# Patient Record
Sex: Male | Born: 1940 | Race: Asian | Hispanic: Yes | Marital: Married | Smoking: Never smoker
Health system: Southern US, Community
[De-identification: ages and names within clinical notes are randomized; demographics above are authoritative.]

## PROBLEM LIST (undated history)

## (undated) DIAGNOSIS — N400 Enlarged prostate without lower urinary tract symptoms: Secondary | ICD-10-CM

## (undated) HISTORY — PX: ABDOMINAL SURGERY: SHX537

## (undated) HISTORY — PX: CHOLECYSTECTOMY: SHX55

---

## 2017-10-24 ENCOUNTER — Emergency Department (HOSPITAL_COMMUNITY): Payer: Self-pay

## 2017-10-24 ENCOUNTER — Encounter (HOSPITAL_COMMUNITY): Payer: Self-pay | Admitting: *Deleted

## 2017-10-24 ENCOUNTER — Emergency Department (HOSPITAL_BASED_OUTPATIENT_CLINIC_OR_DEPARTMENT_OTHER)
Admit: 2017-10-24 | Discharge: 2017-10-24 | Disposition: A | Discharge status: Home / Self Care | Payer: Self-pay | Attending: Emergency Medicine | Admitting: Emergency Medicine

## 2017-10-24 ENCOUNTER — Other Ambulatory Visit: Payer: Self-pay

## 2017-10-24 ENCOUNTER — Inpatient Hospital Stay (HOSPITAL_COMMUNITY)
Admission: EM | Admit: 2017-10-24 | Discharge: 2017-10-29 | DRG: 299 | Disposition: A | Discharge status: Home / Self Care | Payer: Self-pay | Attending: Family Medicine | Admitting: Family Medicine

## 2017-10-24 DIAGNOSIS — I82402 Acute embolism and thrombosis of unspecified deep veins of left lower extremity: Secondary | ICD-10-CM | POA: Diagnosis present

## 2017-10-24 DIAGNOSIS — N400 Enlarged prostate without lower urinary tract symptoms: Secondary | ICD-10-CM | POA: Diagnosis present

## 2017-10-24 DIAGNOSIS — I959 Hypotension, unspecified: Secondary | ICD-10-CM | POA: Diagnosis not present

## 2017-10-24 DIAGNOSIS — R112 Nausea with vomiting, unspecified: Secondary | ICD-10-CM | POA: Diagnosis not present

## 2017-10-24 DIAGNOSIS — E876 Hypokalemia: Secondary | ICD-10-CM | POA: Diagnosis present

## 2017-10-24 DIAGNOSIS — I82442 Acute embolism and thrombosis of left tibial vein: Secondary | ICD-10-CM | POA: Diagnosis present

## 2017-10-24 DIAGNOSIS — J189 Pneumonia, unspecified organism: Secondary | ICD-10-CM | POA: Diagnosis present

## 2017-10-24 DIAGNOSIS — R000 Tachycardia, unspecified: Secondary | ICD-10-CM

## 2017-10-24 DIAGNOSIS — I82432 Acute embolism and thrombosis of left popliteal vein: Principal | ICD-10-CM | POA: Diagnosis present

## 2017-10-24 DIAGNOSIS — Z86718 Personal history of other venous thrombosis and embolism: Secondary | ICD-10-CM

## 2017-10-24 DIAGNOSIS — I82412 Acute embolism and thrombosis of left femoral vein: Secondary | ICD-10-CM | POA: Diagnosis present

## 2017-10-24 DIAGNOSIS — Z9049 Acquired absence of other specified parts of digestive tract: Secondary | ICD-10-CM

## 2017-10-24 DIAGNOSIS — K029 Dental caries, unspecified: Secondary | ICD-10-CM | POA: Diagnosis present

## 2017-10-24 DIAGNOSIS — R1084 Generalized abdominal pain: Secondary | ICD-10-CM

## 2017-10-24 DIAGNOSIS — I82492 Acute embolism and thrombosis of other specified deep vein of left lower extremity: Secondary | ICD-10-CM | POA: Diagnosis present

## 2017-10-24 DIAGNOSIS — R160 Hepatomegaly, not elsewhere classified: Secondary | ICD-10-CM | POA: Diagnosis present

## 2017-10-24 DIAGNOSIS — I82409 Acute embolism and thrombosis of unspecified deep veins of unspecified lower extremity: Secondary | ICD-10-CM | POA: Diagnosis present

## 2017-10-24 DIAGNOSIS — R0602 Shortness of breath: Secondary | ICD-10-CM

## 2017-10-24 DIAGNOSIS — M7989 Other specified soft tissue disorders: Secondary | ICD-10-CM

## 2017-10-24 DIAGNOSIS — R600 Localized edema: Secondary | ICD-10-CM | POA: Diagnosis present

## 2017-10-24 HISTORY — DX: Benign prostatic hyperplasia without lower urinary tract symptoms: N40.0

## 2017-10-24 LAB — URINALYSIS, ROUTINE W REFLEX MICROSCOPIC
BILIRUBIN URINE: NEGATIVE
GLUCOSE, UA: NEGATIVE mg/dL
Hgb urine dipstick: NEGATIVE
KETONES UR: NEGATIVE mg/dL
Leukocytes, UA: NEGATIVE
Nitrite: NEGATIVE
PROTEIN: NEGATIVE mg/dL
Specific Gravity, Urine: 1.02 (ref 1.005–1.030)
pH: 5 (ref 5.0–8.0)

## 2017-10-24 LAB — COMPREHENSIVE METABOLIC PANEL
ALT: 41 U/L (ref 0–44)
AST: 46 U/L — AB (ref 15–41)
Albumin: 3 g/dL — ABNORMAL LOW (ref 3.5–5.0)
Alkaline Phosphatase: 73 U/L (ref 38–126)
Anion gap: 11 (ref 5–15)
BUN: 5 mg/dL — AB (ref 8–23)
CHLORIDE: 105 mmol/L (ref 98–111)
CO2: 22 mmol/L (ref 22–32)
CREATININE: 1.01 mg/dL (ref 0.61–1.24)
Calcium: 8.7 mg/dL — ABNORMAL LOW (ref 8.9–10.3)
GFR calc Af Amer: >60 mL/min (ref >60)
Glucose, Bld: 127 mg/dL — ABNORMAL HIGH (ref 70–99)
Potassium: 3.7 mmol/L (ref 3.5–5.1)
SODIUM: 138 mmol/L (ref 135–145)
Total Bilirubin: 1.2 mg/dL (ref 0.3–1.2)
Total Protein: 5.9 g/dL — ABNORMAL LOW (ref 6.5–8.1)

## 2017-10-24 LAB — CBC
HCT: 44.7 % (ref 39.0–52.0)
Hemoglobin: 14.2 g/dL (ref 13.0–17.0)
MCH: 31.7 pg (ref 26.0–34.0)
MCHC: 31.8 g/dL (ref 30.0–36.0)
MCV: 99.8 fL (ref 78.0–100.0)
PLATELETS: 187 10*3/uL (ref 150–400)
RBC: 4.48 MIL/uL (ref 4.22–5.81)
RDW: 14.7 % (ref 11.5–15.5)
WBC: 7.3 10*3/uL (ref 4.0–10.5)

## 2017-10-24 LAB — I-STAT TROPONIN, ED
TROPONIN I, POC: 0 ng/mL (ref 0.00–0.08)
Troponin i, poc: 0 ng/mL (ref 0.00–0.08)

## 2017-10-24 LAB — LIPASE, BLOOD: LIPASE: 26 U/L (ref 11–51)

## 2017-10-24 LAB — MAGNESIUM: MAGNESIUM: 1.9 mg/dL (ref 1.7–2.4)

## 2017-10-24 LAB — BRAIN NATRIURETIC PEPTIDE: B Natriuretic Peptide: 32.4 pg/mL (ref 0.0–100.0)

## 2017-10-24 LAB — AMMONIA: Ammonia: 18 umol/L (ref 9–35)

## 2017-10-24 LAB — TSH: TSH: 2.204 u[IU]/mL (ref 0.350–4.500)

## 2017-10-24 LAB — PROTIME-INR
INR: 1.02
Prothrombin Time: 13.3 seconds (ref 11.4–15.2)

## 2017-10-24 MED ORDER — HEPARIN BOLUS VIA INFUSION
4000.0000 [IU] | Freq: Once | INTRAVENOUS | Status: AC
  Administered 2017-10-24: 4000 [IU] via INTRAVENOUS
  Filled 2017-10-24: qty 4000

## 2017-10-24 MED ORDER — SODIUM CHLORIDE 0.9% FLUSH
3.0000 mL | Freq: Two times a day (BID) | INTRAVENOUS | Status: DC
  Administered 2017-10-25 – 2017-10-29 (×4): 3 mL via INTRAVENOUS

## 2017-10-24 MED ORDER — ADULT MULTIVITAMIN W/MINERALS CH
1.0000 | ORAL_TABLET | Freq: Every day | ORAL | Status: DC
  Administered 2017-10-25 – 2017-10-29 (×5): 1 via ORAL
  Filled 2017-10-24 (×5): qty 1

## 2017-10-24 MED ORDER — ONDANSETRON HCL 4 MG PO TABS
4.0000 mg | ORAL_TABLET | Freq: Four times a day (QID) | ORAL | Status: DC | PRN
  Administered 2017-10-25: 4 mg via ORAL
  Filled 2017-10-24: qty 1

## 2017-10-24 MED ORDER — ONDANSETRON HCL 4 MG/2ML IJ SOLN
4.0000 mg | Freq: Four times a day (QID) | INTRAMUSCULAR | Status: DC | PRN
  Administered 2017-10-26 – 2017-10-28 (×4): 4 mg via INTRAVENOUS
  Filled 2017-10-24 (×4): qty 2

## 2017-10-24 MED ORDER — FINASTERIDE 5 MG PO TABS
5.0000 mg | ORAL_TABLET | Freq: Every day | ORAL | Status: DC
  Administered 2017-10-24 – 2017-10-28 (×5): 5 mg via ORAL
  Filled 2017-10-24 (×5): qty 1

## 2017-10-24 MED ORDER — IOHEXOL 300 MG/ML  SOLN
100.0000 mL | Freq: Once | INTRAMUSCULAR | Status: AC | PRN
  Administered 2017-10-24: 100 mL via INTRAVENOUS

## 2017-10-24 MED ORDER — AZITHROMYCIN 500 MG IV SOLR: 500.0000 mg | Freq: Once | INTRAVENOUS | Status: DC

## 2017-10-24 MED ORDER — SODIUM CHLORIDE 0.9% FLUSH: 3.0000 mL | INTRAVENOUS | Status: DC | PRN

## 2017-10-24 MED ORDER — TAMSULOSIN HCL 0.4 MG PO CAPS
0.4000 mg | ORAL_CAPSULE | Freq: Every day | ORAL | Status: DC
  Administered 2017-10-24 – 2017-10-28 (×5): 0.4 mg via ORAL
  Filled 2017-10-24 (×5): qty 1

## 2017-10-24 MED ORDER — IOPAMIDOL (ISOVUE-370) INJECTION 76%
80.0000 mL | Freq: Once | INTRAVENOUS | Status: AC | PRN
  Administered 2017-10-24: 80 mL via INTRAVENOUS

## 2017-10-24 MED ORDER — SODIUM CHLORIDE 0.9% FLUSH
3.0000 mL | Freq: Two times a day (BID) | INTRAVENOUS | Status: DC
  Administered 2017-10-24 – 2017-10-28 (×6): 3 mL via INTRAVENOUS

## 2017-10-24 MED ORDER — ACETAMINOPHEN 650 MG RE SUPP: 650.0000 mg | Freq: Four times a day (QID) | RECTAL | Status: DC | PRN

## 2017-10-24 MED ORDER — HYDROCODONE-ACETAMINOPHEN 5-325 MG PO TABS
1.0000 | ORAL_TABLET | ORAL | Status: DC | PRN
  Administered 2017-10-25: 1 via ORAL
  Administered 2017-10-26 – 2017-10-28 (×2): 2 via ORAL
  Filled 2017-10-24 (×3): qty 2

## 2017-10-24 MED ORDER — SODIUM CHLORIDE 0.9 % IV SOLN: 1.0000 g | Freq: Once | INTRAVENOUS | Status: DC

## 2017-10-24 MED ORDER — ACETAMINOPHEN 325 MG PO TABS: 650.0000 mg | ORAL_TABLET | Freq: Four times a day (QID) | ORAL | Status: DC | PRN

## 2017-10-24 MED ORDER — SENNOSIDES-DOCUSATE SODIUM 8.6-50 MG PO TABS
1.0000 | ORAL_TABLET | Freq: Every evening | ORAL | Status: DC | PRN
  Administered 2017-10-26: 1 via ORAL
  Filled 2017-10-24: qty 1

## 2017-10-24 MED ORDER — HEPARIN (PORCINE) IN NACL 100-0.45 UNIT/ML-% IJ SOLN
1550.0000 [IU]/h | INTRAMUSCULAR | Status: DC
  Administered 2017-10-24 – 2017-10-25 (×2): 1350 [IU]/h via INTRAVENOUS
  Administered 2017-10-25 – 2017-10-26 (×3): 1550 [IU]/h via INTRAVENOUS
  Filled 2017-10-24 (×4): qty 250

## 2017-10-24 MED ORDER — PANTOPRAZOLE SODIUM 40 MG PO TBEC
40.0000 mg | DELAYED_RELEASE_TABLET | Freq: Every day | ORAL | Status: DC
  Administered 2017-10-25 – 2017-10-29 (×5): 40 mg via ORAL
  Filled 2017-10-24 (×5): qty 1

## 2017-10-24 MED ORDER — SODIUM CHLORIDE 0.9 % IV SOLN
250.0000 mL | INTRAVENOUS | Status: DC | PRN
  Administered 2017-10-27: 250 mL via INTRAVENOUS

## 2017-10-24 NOTE — ED Provider Notes (Signed)
[facility] Provider Note   CSN: [id number] Arrival date & time: 10/24/17  1357     History   Chief Complaint Chief Complaint  Patient presents with  . Abdominal Pain    HPI Travis Carr is a 77 y.o. male.  The history is provided by the patient and medical records. No language interpreter was used.  Shortness of Breath  This is a new problem. The problem occurs continuously.The current episode started more than 2 days ago. The problem has been gradually worsening. Associated symptoms include cough, abdominal pain, leg pain and leg swelling. Pertinent negatives include no fever, no headaches, no neck pain, no sputum production, no hemoptysis, no wheezing, no chest pain, no syncope and no vomiting. He has tried nothing for the symptoms. Associated medical issues do not include asthma, heart failure, past MI or DVT.    History reviewed. No pertinent past medical history.  There are no active problems to display for this patient.   Past Surgical History:  Procedure Laterality Date  . ABDOMINAL SURGERY    . CHOLECYSTECTOMY          Home Medications    Prior to Admission medications   Not on File    Family History History reviewed. No pertinent family history.  Social History Social History   Tobacco Use  . Smoking status: Never Smoker  . Smokeless tobacco: Never Used  Substance Use Topics  . Alcohol use: Not on file  . Drug use: Not on file     Allergies   Patient has no known allergies.   Review of Systems Review of Systems  Constitutional: Negative for chills, diaphoresis, fatigue and fever.  HENT: Negative for congestion.   Respiratory: Positive for cough and shortness of breath. Negative for hemoptysis, sputum production, chest tightness, wheezing and stridor.   Cardiovascular: Positive for leg swelling. Negative for chest pain, palpitations and syncope.  Gastrointestinal: Positive for abdominal distention and  abdominal pain. Negative for constipation, diarrhea, nausea and vomiting.  Genitourinary: Negative for dysuria and frequency.  Musculoskeletal: Negative for back pain, neck pain and neck stiffness.  Neurological: Negative for weakness, light-headedness and headaches.  Psychiatric/Behavioral: Negative for agitation and confusion.  All other systems reviewed and are negative.    Physical Exam Updated Vital Signs BP 104/79 (BP Location: Left Arm)   Pulse (!) 111   Temp 98.6 F (37 C) (Oral)   Resp 14   SpO2 95%   Physical Exam  Constitutional: He is oriented to person, place, and time. He appears well-developed and well-nourished. No distress.  HENT:  Head: Normocephalic and atraumatic.  Nose: Nose normal.  Mouth/Throat: Oropharynx is clear and moist. No oropharyngeal exudate.  Eyes: Pupils are equal, round, and reactive to light. Conjunctivae and EOM are normal.  Neck: Normal range of motion. Neck supple.  Cardiovascular: Regular rhythm and intact distal pulses. Tachycardia present.  No murmur heard. Pulmonary/Chest: Breath sounds normal. Tachypnea noted. No respiratory distress. He has no wheezes. He has no rales. He exhibits no tenderness.  Abdominal: Soft. There is no tenderness.  Musculoskeletal: He exhibits edema and tenderness. He exhibits no deformity.       Legs: Neurological: He is alert and oriented to person, place, and time. No sensory deficit. He exhibits normal muscle tone.  Skin: Skin is warm and dry. Capillary refill takes less than 2 seconds. He is not diaphoretic. No erythema. No pallor.  Psychiatric: He has a normal mood and affect.  Nursing note  and vitals reviewed.    ED Treatments / Results  Labs (all labs ordered are listed, but only abnormal results are displayed) Labs Reviewed  COMPREHENSIVE METABOLIC PANEL - Abnormal; Notable for the following components:      Result Value   Glucose, Bld 127 (*)    BUN 5 (*)    Calcium 8.7 (*)    Total Protein  5.9 (*)    Albumin 3.0 (*)    AST 46 (*)    All other components within normal limits  URINALYSIS, ROUTINE W REFLEX MICROSCOPIC - Abnormal; Notable for the following components:   Color, Urine AMBER (*)    APPearance HAZY (*)    All other components within normal limits  URINE CULTURE  LIPASE, BLOOD  CBC  AMMONIA  PROTIME-INR  BRAIN NATRIURETIC PEPTIDE  MAGNESIUM  TSH  BASIC METABOLIC PANEL  HEPARIN LEVEL (UNFRACTIONATED)  CBC  I-STAT TROPONIN, ED  I-STAT TROPONIN, ED    EKG None  Radiology Dg Chest 2 View  Result Date: 10/24/2017 CLINICAL DATA:  Shortness of breath EXAM: CHEST - 2 VIEW COMPARISON:  None. FINDINGS: There is patchy opacity in the right middle lobe. There is slight atelectasis in the left base. Lungs elsewhere clear. Heart size and pulmonary vascularity are normal. No adenopathy. No bone lesions. IMPRESSION: Patchy opacity in the right middle lobe consistent with a degree of pneumonia and atelectasis. Mild atelectasis left base. Lungs elsewhere clear. Heart size normal. Electronically Signed   By: Bretta Bang III M.D.   On: 10/24/2017 16:58   Ct Angio Chest Pe W And/or Wo Contrast  Result Date: 10/24/2017 CLINICAL DATA:  Exertional dyspnea. Reported left lower extremity DVT. EXAM: CT ANGIOGRAPHY CHEST WITH CONTRAST TECHNIQUE: Multidetector CT imaging of the chest was performed using the standard protocol during bolus administration of intravenous contrast. Multiplanar CT image reconstructions and MIPs were obtained to evaluate the vascular anatomy. CONTRAST:  80mL ISOVUE-370 IOPAMIDOL (ISOVUE-370) INJECTION 76% COMPARISON:  Chest radiograph from earlier today. FINDINGS: Cardiovascular: The study is high quality for the evaluation of pulmonary embolism. There are no filling defects in the central, lobar, segmental or subsegmental pulmonary artery branches to suggest acute pulmonary embolism. Mildly atherosclerotic nonaneurysmal thoracic aorta. Normal caliber  pulmonary arteries. Normal heart size. No significant pericardial fluid/thickening. Mediastinum/Nodes: No discrete thyroid nodules. Unremarkable esophagus. No pathologically enlarged axillary, mediastinal or hilar lymph nodes. Lungs/Pleura: No pneumothorax. No pleural effusion. No acute consolidative airspace disease, lung masses or significant pulmonary nodules. Parenchymal bands in the lower lobes could represent scarring or mild atelectasis. Upper abdomen: Hypodense 2.7 cm inferior right liver mass (series 5/image 110). Musculoskeletal: No aggressive appearing focal osseous lesions. Mild thoracic spondylosis. Review of the MIP images confirms the above findings. IMPRESSION: 1. No pulmonary embolism. 2. Mild scarring versus atelectasis in the lower lobes. 3. Indeterminate hypodense 2.7 cm inferior right liver mass. MRI abdomen without and with IV contrast recommended for further characterization, which may be performed on a short term outpatient basis. Aortic Atherosclerosis (ICD10-I70.0). Electronically Signed   By: Delbert Phenix M.D.   On: 10/24/2017 20:18   Ct Abdomen Pelvis W Contrast  Result Date: 10/24/2017 CLINICAL DATA:  History of recent cholecystectomy with abdominal pain, initial encounter EXAM: CT ABDOMEN AND PELVIS WITH CONTRAST TECHNIQUE: Multidetector CT imaging of the abdomen and pelvis was performed using the standard protocol following bolus administration of intravenous contrast. CONTRAST:  OMNIPAQUE IOHEXOL 300 MG/ML  SOLN COMPARISON:  None. FINDINGS: Lower chest: Within normal limits. Hepatobiliary: Gallbladder  has been surgically removed. No postsurgical fluid collection is identified. Fatty infiltration of the liver is noted. Hypodense lesion is noted measuring 2.8 cm in the right lobe of the liver laterally. It shows changes of peripheral calcification and may represent a complicated cyst. Nonemergent ultrasound may be helpful for further evaluation. Pancreas: Unremarkable. No  pancreatic ductal dilatation or surrounding inflammatory changes. Spleen: Normal in size without focal abnormality. Adrenals/Urinary Tract: Adrenal glands are within normal limits. Kidneys are well visualized bilaterally. Excretion of contrast is noted. No obstructive changes are seen. A large left renal cyst is noted measuring 10.6 cm. Bladder is partially distended. Stomach/Bowel: Scattered diverticular change of the colon is noted without evidence of diverticulitis. No obstructive or inflammatory changes are seen. The appendix is not well appreciated and may have been surgically removed. No inflammatory changes are seen. No small bowel abnormality is noted. Stomach is within normal limits. Vascular/Lymphatic: Aortic atherosclerosis. No enlarged abdominal or pelvic lymph nodes. Reproductive: Prostate is unremarkable. Other: No abdominal wall hernia or abnormality. No abdominopelvic ascites. Musculoskeletal: Degenerative changes of lumbar spine are seen. IMPRESSION: Fatty infiltration of the liver. Hypodense lesion in the right lobe of the liver with peripheral calcifications suggestive of complicated cyst. Nonemergent ultrasound would be helpful for further evaluation. Large left renal cyst. Diverticulosis without diverticulitis. Electronically Signed   By: Alcide Clever M.D.   On: 10/24/2017 19:57    Procedures Procedures (including critical care time)  Medications Ordered in ED Medications  heparin ADULT infusion 100 units/mL (25000 units/265mL sodium chloride 0.45%) (1,350 Units/hr Intravenous New Bag/Given 10/24/17 2220)  finasteride (PROSCAR) tablet 5 mg (has no administration in time range)  tamsulosin (FLOMAX) capsule 0.4 mg (has no administration in time range)  multivitamin with minerals tablet 1 tablet (has no administration in time range)  sodium chloride flush (NS) 0.9 % injection 3 mL (has no administration in time range)  sodium chloride flush (NS) 0.9 % injection 3 mL (3 mLs Intravenous  Given 10/24/17 2222)  sodium chloride flush (NS) 0.9 % injection 3 mL (has no administration in time range)  0.9 %  sodium chloride infusion (has no administration in time range)  acetaminophen (TYLENOL) tablet 650 mg (has no administration in time range)    Or  acetaminophen (TYLENOL) suppository 650 mg (has no administration in time range)  HYDROcodone-acetaminophen (NORCO/VICODIN) 5-325 MG per tablet 1-2 tablet (has no administration in time range)  senna-docusate (Senokot-S) tablet 1 tablet (has no administration in time range)  ondansetron (ZOFRAN) tablet 4 mg (has no administration in time range)    Or  ondansetron (ZOFRAN) injection 4 mg (has no administration in time range)  pantoprazole (PROTONIX) EC tablet 40 mg (has no administration in time range)  iohexol (OMNIPAQUE) 300 MG/ML solution 100 mL (100 mLs Intravenous Contrast Given 10/24/17 1901)  iopamidol (ISOVUE-370) 76 % injection 80 mL (80 mLs Intravenous Contrast Given 10/24/17 1928)  heparin bolus via infusion 4,000 Units (4,000 Units Intravenous Bolus from Bag 10/24/17 2221)     Initial Impression / Assessment and Plan / ED Course  I have reviewed the triage vital signs and the nursing notes.  Pertinent labs & imaging results that were available during my care of the patient were reviewed by me and considered in my medical decision making (see chart for details).     Travis Carr is a 77 y.o. male who is Spanish-speaking from [place] with a reported past medical history significant for prior typhoid fever, acute cholecystitis, and prior abdominal hernia status  post surgeries who presents with peripheral edema, fatigue, exertional shortness of breath, abdominal distention, nausea, and abdominal pain.  Patient reports that he has had gradually worsening symptoms for the last month.  He reports that he has been feeling very tired and drained.  He reports that he has been feeling intermittent palpitations.  He reports that over the  last few weeks he has had exertional shortness of breath with any type of exertion.  He denies chest pain with it.  He does report nausea with no vomiting and no diaphoresis.  He reports that he has had no urinary symptoms, constipation, or diarrhea.  He reports that his abdomen has been worsening with distention and he thinks this may have been gradually worsening since his surgery several months ago for a hernia.  He reports his previous care was in [place] and he only got to the [place] a month and a half ago.  He says that he is also had bilateral lower extremity swelling left worse than right and he is concerned about this.  He denies any history of DVT, PE, or CHF.  He denies other complaints on arrival.  On exam, patient has pitting edema in both lower extremities, left worse than right.  He also has some erythema on his left leg.  Patient had symmetric DP and PT pulses and had normal sensation strength in the legs.  Patient had diffusely distended abdomen with minimal tenderness.  Patient had no flank tenderness or CVA tenderness.  Patient had crackles in both lung bases.  No murmur was appreciated.  Chest and back were nontender.  Clinically I am concerned about a CHF diagnosis and exacerbation for the patient.  I am also concerned about possible liver or other intra-abdominal pathology such as hernia or partial obstruction given the patient's nausea and abdominal swelling after surgery.  Patient will have DVT study to rule out thrombus in his more swollen left leg.  Anticipate reassessment after diagnostic work-up.  6:05 PM diagnostic testing began to return.  Troponin was negative.  Lipase not elevated.  Metabolic panel showed normal creatinine and slightly elevated AST.  CBC reassuring.  Chest x-ray shows concern for right middle lobe pneumonia and atelectasis.  Given the patient's exertional shortness of breath, fatigue, patient may have occult pneumonia.  DVT study also returned showing  acute DVT in the left leg.  Given the concern for respiratory symptoms with the newly discovered DVT, patient will have a PE study to rule out pulmonary embolism.  Still awaiting other laboratory testing however anticipate patient will require admission for further management.  9:22 PM Patient was reassessed by me and reports that he is having significant cough and chills over the last few days.  Given the x-ray showing evidence of pneumonia along with these results, patient will be treated for pneumonia.  Patient's vital signs also remained tachycardic and tachypneic although he is on room air satting well.  Patient will be started on heparin for the DVT in the left leg that goes up to the femoral vein.  Given the proximal clot, this is another reason patient will need admission.  Unassigned medicine team will be called for admission for the large DVT as well as his pneumonia with exertional shortness of breath.   Final Clinical Impressions(s) / ED Diagnoses   Final diagnoses:  Acute deep vein thrombosis (DVT) of femoral vein of left lower extremity (HCC)  Generalized abdominal pain  Exertional shortness of breath  Community acquired pneumonia, unspecified  laterality  Liver mass  Tachycardia    ED Discharge Orders    None      Clinical Impression: 1. Acute deep vein thrombosis (DVT) of femoral vein of left lower extremity (HCC)   2. Generalized abdominal pain   3. Exertional shortness of breath   4. Community acquired pneumonia, unspecified laterality   5. Liver mass   6. Tachycardia     Disposition: Admit  This note was prepared with assistance of Dragon voice recognition software. Occasional wrong-word or sound-a-like substitutions may have occurred due to the inherent limitations of voice recognition software.     Tegeler, Canary Brimhristopher J, MD 10/24/17 [phone]

## 2017-10-24 NOTE — Progress Notes (Signed)
ANTICOAGULATION CONSULT NOTE - Initial Consult  Pharmacy Consult for heparin Indication: DVT  No Known Allergies  Patient Measurements: Ideal Body weight: 68 kg Heparin Dosing Weight: 81 kg   Vital Signs: Temp: 98.6 F (37 C) (08/13 1521) Temp Source: Oral (08/13 1521) BP: 104/86 (08/13 2000) Pulse Rate: 97 (08/13 2045)  Labs: Recent Labs    10/24/17 1425 10/24/17 1727  HGB 14.2  --   HCT 44.7  --   PLT 187  --   LABPROT  --  13.3  INR  --  1.02  CREATININE 1.01  --     CrCl cannot be calculated (Unknown ideal weight.).   Medical History: History reviewed. No pertinent past medical history.  Medications:   (Not in a hospital admission)  Assessment: [age] YOM who presents with peripheral edema, fatigue, shortness of breath and abdominal pain. He was found to have acute LE DVT. CT angio did not show a PE. Pharmacy consulted to start IV heparin. Patient is not on any anticoagulation prior to admission. H/H and Plt wnl. SCr 1.01.   Goal of Therapy:  Heparin level 0.3-0.7 units/ml Monitor platelets by anticoagulation protocol: Yes   Plan:  -Heparin 4000 units IV once, then start IV heparin at 1350 units/hr -F/u 8 hr HL -Monitor daily HL, CBC and s/s of bleeding   Vinnie LevelBenjamin Gryphon Vanderveen, PharmD., BCPS Clinical Pharmacist Clinical phone for 10/24/17 until 3:30pm: [phone] If after 3:30pm, [facility] for unit-specific pharmacist

## 2017-10-24 NOTE — ED Triage Notes (Signed)
Pt in c/o abdominal pain and fullness since an abdominal surgery 7 months ago, also c/o bilateral foot swelling for two months

## 2017-10-24 NOTE — ED Provider Notes (Signed)
Patient placed in Quick Look pathway, seen and evaluated   Chief Complaint: abdominal pain HPI:   Pt unable to eat for the past 3 days.  Pt feels swollen  ROS: no fever, no chill,    Physical Exam:   Gen: No distress  Neuro: Awake and Alert  Skin: Warm    Focused Exam: abdomen distended diffusely tender    Initiation of care has begun. The patient has been counseled on the process, plan, and necessity for staying for the completion/evaluation, and the remainder of the medical screening examination   Osie CheeksSofia, Hayley Horn K, PA-C 10/24/17 1424    Derwood KaplanNanavati, Ankit, MD 10/27/17 1035

## 2017-10-24 NOTE — H&P (Signed)
History and Physical    Drakkar Medeiros XLK:[id number] DOB: Sep 22, 1940 DOA: 10/24/2017  PCP: Patient, No Pcp Per   Patient coming from: Home   Chief Complaint: Left leg swelling, abdominal fullness   HPI: Travis Carr is a 77 y.o. male with medical history significant for BPH, remote cholecystectomy, and abdominal hernia repair several months ago, now presenting to the emergency department for evaluation of left leg swelling and abdominal fullness.  Patient reports that he underwent repair of an abdominal hernia several months ago, had return to his usual state, but then noted some vague abdominal discomfort and sensation of "fullness" over the past 2 months without any significant nausea, vomiting, or diarrhea.  He had mild edema involving both legs approximately 2 months ago, was also complaining of bilateral knee pain at the time, was started on prednisone and Bumex by his physician in [place].  The pain resolved and swelling improved significantly, but the left leg became swollen again within the past month, beginning after a 24-hour bus ride from [place].  Patient denies any chest pain, cough, or shortness of breath.  He denies any history of melena, hematochezia, easy bruising or bleeding, or head bleed.  Denies frequent falls.  Denies any history of cancer.  ED Course: Upon arrival to the ED, patient is found to be afebrile, saturating well on room air, slightly tachypneic, tachycardic in the 110s, and with stable blood pressure.  EKG features a sinus tachycardia with rate 103.  Chest x-ray features a patchy opacity in the right middle lobe, likely representing pneumonia or atelectasis.  Bilateral lower extremity venous Dopplers are negative for left leg DVT, but notable for acute DVT throughout the right leg.  CTA chest is negative for PE or any acute cardiopulmonary process, but notable for indeterminate 2.7 cm right liver mass with short-term outpatient MRI recommended.  Chemistry panel  is notable for an albumin of 3.0 and slight elevation in AST.  CBC is unremarkable.  Troponin is undetectable.  BNP is normal and urinalysis unremarkable.  Patient was started on IV heparin infusion in the ED, remains slightly tachycardic, and will be observed in the hospital for ongoing evaluation and management.  Review of Systems:  All other systems reviewed and apart from HPI, are negative.  Past Medical History:  Diagnosis Date  . BPH (benign prostatic hyperplasia)     Past Surgical History:  Procedure Laterality Date  . ABDOMINAL SURGERY    . CHOLECYSTECTOMY       reports that he has never smoked. He has never used smokeless tobacco. He reports that he drank alcohol. He reports that he does not use drugs.  No Known Allergies  Family History  Problem Relation Age of Onset  . Deep vein thrombosis Neg Hx   . Pulmonary embolism Neg Hx      Prior to Admission medications   Medication Sig Start Date End Date Taking? Authorizing Provider  acetaminophen (TYLENOL) 650 MG CR tablet Take 650 mg by mouth every 8 (eight) hours as needed for pain.   Yes [provider]  bumetanide (BUMEX) 1 MG tablet Take 1 mg by mouth daily.   Yes [provider]  finasteride (PROSCAR) 5 MG tablet Take 5 mg by mouth at bedtime.   Yes [provider]  Loratadine-Pseudoephedrine (CLARITIN-D 12 HOUR PO) Take 1 tablet by mouth 2 (two) times daily.   Yes [provider]  Multiple Vitamin (MULTIVITAMIN WITH MINERALS) TABS tablet Take 1 tablet by mouth daily.  Yes [provider]  NON FORMULARY Take 4 mg by mouth at bedtime. Supra lidamidia   Yes [provider]  NON FORMULARY Take 1 tablet by mouth 3 (three) times daily. Espaven Enzimatico   Yes [provider]  NON FORMULARY Apply 1 application topically daily as needed (allergic reaction). Barmacil Compuesto (betametasone, clotrimazole, gentamicin 50mg )   Yes [provider]    pantoprazole (PROTONIX) 40 MG tablet Take 40 mg by mouth daily.   Yes [provider]  predniSONE (DELTASONE) 50 MG tablet Take 50 mg by mouth daily with breakfast.   Yes [provider]  tamsulosin (FLOMAX) 0.4 MG CAPS capsule Take 0.4 mg by mouth at bedtime.   Yes [provider]    Physical Exam: Vitals:   10/24/17 2000 10/24/17 2045 10/24/17 2100 10/24/17 2153  BP: 104/86     Pulse: 95 97    Resp: 18 (!) 29    Temp:    98.1 F (36.7 C)  TempSrc:    Oral  SpO2: 98% 96%    Weight:   81.2 kg   Height:   5' 8.9" (1.75 m)       Constitutional: NAD, calm  Eyes: PERTLA, lids and conjunctivae normal ENMT: Mucous membranes are moist. Posterior pharynx clear of any exudate or lesions.   Neck: normal, supple, no masses, no thyromegaly Respiratory: clear to auscultation bilaterally, no wheezing, no crackles. Normal respiratory effort.    Cardiovascular: Rate ~110 and regular. Left leg edema. 2+ pedal pulses.   Abdomen: soft, no tenderness. Bowel sounds active.   Musculoskeletal: no clubbing / cyanosis. No joint deformity upper and lower extremities.    Skin: no significant rashes, lesions, ulcers. Warm, dry, well-perfused. Neurologic: CN 2-12 grossly intact. Sensation intact. Strength 5/5 in all 4 limbs.  Psychiatric: Alert and oriented to person, place, and situation. Calm and cooperative.     Labs on Admission: I have personally reviewed following labs and imaging studies  CBC: Recent Labs  Lab 10/24/17 1425  WBC 7.3  HGB 14.2  HCT 44.7  MCV 99.8  PLT 187   Basic Metabolic Panel: Recent Labs  Lab 10/24/17 1425 10/24/17 1727  NA 138  --   K 3.7  --   CL 105  --   CO2 22  --   GLUCOSE 127*  --   BUN 5*  --   CREATININE 1.01  --   CALCIUM 8.7*  --   MG  --  1.9   GFR: Estimated Creatinine Clearance: 64 mL/min (by C-G formula based on SCr of 1.01 mg/dL). Liver Function Tests: Recent Labs  Lab 10/24/17 1425  AST 46*  ALT 41   ALKPHOS 73  BILITOT 1.2  PROT 5.9*  ALBUMIN 3.0*   Recent Labs  Lab 10/24/17 1425  LIPASE 26   Recent Labs  Lab 10/24/17 1735  AMMONIA 18   Coagulation Profile: Recent Labs  Lab 10/24/17 1727  INR 1.02   Cardiac Enzymes: No results for input(s): CKTOTAL, CKMB, CKMBINDEX, TROPONINI in the last 168 hours. BNP (last 3 results) No results for input(s): PROBNP in the last 8760 hours. HbA1C: No results for input(s): HGBA1C in the last 72 hours. CBG: No results for input(s): GLUCAP in the last 168 hours. Lipid Profile: No results for input(s): CHOL, HDL, LDLCALC, TRIG, CHOLHDL, LDLDIRECT in the last 72 hours. Thyroid Function Tests: Recent Labs    10/24/17 1727  TSH 2.204   Anemia Panel: No results for input(s): VITAMINB12,  FOLATE, FERRITIN, TIBC, IRON, RETICCTPCT in the last 72 hours. Urine analysis:    Component Value Date/Time   COLORURINE AMBER (A) 10/24/2017 1910   APPEARANCEUR HAZY (A) 10/24/2017 1910   LABSPEC 1.020 10/24/2017 1910   PHURINE 5.0 10/24/2017 1910   GLUCOSEU NEGATIVE 10/24/2017 1910   HGBUR NEGATIVE 10/24/2017 1910   BILIRUBINUR NEGATIVE 10/24/2017 1910   KETONESUR NEGATIVE 10/24/2017 1910   PROTEINUR NEGATIVE 10/24/2017 1910   NITRITE NEGATIVE 10/24/2017 1910   LEUKOCYTESUR NEGATIVE 10/24/2017 1910   Sepsis Labs: @LABRCNTIP (procalcitonin:4,lacticidven:4) )No results found for this or any previous visit (from the past 240 hour(s)).   Radiological Exams on Admission: Dg Chest 2 View  Result Date: 10/24/2017 CLINICAL DATA:  Shortness of breath EXAM: CHEST - 2 VIEW COMPARISON:  None. FINDINGS: There is patchy opacity in the right middle lobe. There is slight atelectasis in the left base. Lungs elsewhere clear. Heart size and pulmonary vascularity are normal. No adenopathy. No bone lesions. IMPRESSION: Patchy opacity in the right middle lobe consistent with a degree of pneumonia and atelectasis. Mild atelectasis left base. Lungs elsewhere  clear. Heart size normal. Electronically Signed   By: Bretta BangWilliam  Woodruff III M.D.   On: 10/24/2017 16:58   Ct Angio Chest Pe W And/or Wo Contrast  Result Date: 10/24/2017 CLINICAL DATA:  Exertional dyspnea. Reported left lower extremity DVT. EXAM: CT ANGIOGRAPHY CHEST WITH CONTRAST TECHNIQUE: Multidetector CT imaging of the chest was performed using the standard protocol during bolus administration of intravenous contrast. Multiplanar CT image reconstructions and MIPs were obtained to evaluate the vascular anatomy. CONTRAST:  80mL ISOVUE-370 IOPAMIDOL (ISOVUE-370) INJECTION 76% COMPARISON:  Chest radiograph from earlier today. FINDINGS: Cardiovascular: The study is high quality for the evaluation of pulmonary embolism. There are no filling defects in the central, lobar, segmental or subsegmental pulmonary artery branches to suggest acute pulmonary embolism. Mildly atherosclerotic nonaneurysmal thoracic aorta. Normal caliber pulmonary arteries. Normal heart size. No significant pericardial fluid/thickening. Mediastinum/Nodes: No discrete thyroid nodules. Unremarkable esophagus. No pathologically enlarged axillary, mediastinal or hilar lymph nodes. Lungs/Pleura: No pneumothorax. No pleural effusion. No acute consolidative airspace disease, lung masses or significant pulmonary nodules. Parenchymal bands in the lower lobes could represent scarring or mild atelectasis. Upper abdomen: Hypodense 2.7 cm inferior right liver mass (series 5/image 110). Musculoskeletal: No aggressive appearing focal osseous lesions. Mild thoracic spondylosis. Review of the MIP images confirms the above findings. IMPRESSION: 1. No pulmonary embolism. 2. Mild scarring versus atelectasis in the lower lobes. 3. Indeterminate hypodense 2.7 cm inferior right liver mass. MRI abdomen without and with IV contrast recommended for further characterization, which may be performed on a short term outpatient basis. Aortic Atherosclerosis (ICD10-I70.0).  Electronically Signed   By: Delbert PhenixJason A Poff M.D.   On: 10/24/2017 20:18   Ct Abdomen Pelvis W Contrast  Result Date: 10/24/2017 CLINICAL DATA:  History of recent cholecystectomy with abdominal pain, initial encounter EXAM: CT ABDOMEN AND PELVIS WITH CONTRAST TECHNIQUE: Multidetector CT imaging of the abdomen and pelvis was performed using the standard protocol following bolus administration of intravenous contrast. CONTRAST:  100mL OMNIPAQUE IOHEXOL 300 MG/ML  SOLN COMPARISON:  None. FINDINGS: Lower chest: Within normal limits. Hepatobiliary: Gallbladder has been surgically removed. No postsurgical fluid collection is identified. Fatty infiltration of the liver is noted. Hypodense lesion is noted measuring 2.8 cm in the right lobe of the liver laterally. It shows changes of peripheral calcification and may represent a complicated cyst. Nonemergent ultrasound may be helpful for further evaluation. Pancreas: Unremarkable. No pancreatic ductal  dilatation or surrounding inflammatory changes. Spleen: Normal in size without focal abnormality. Adrenals/Urinary Tract: Adrenal glands are within normal limits. Kidneys are well visualized bilaterally. Excretion of contrast is noted. No obstructive changes are seen. A large left renal cyst is noted measuring 10.6 cm. Bladder is partially distended. Stomach/Bowel: Scattered diverticular change of the colon is noted without evidence of diverticulitis. No obstructive or inflammatory changes are seen. The appendix is not well appreciated and may have been surgically removed. No inflammatory changes are seen. No small bowel abnormality is noted. Stomach is within normal limits. Vascular/Lymphatic: Aortic atherosclerosis. No enlarged abdominal or pelvic lymph nodes. Reproductive: Prostate is unremarkable. Other: No abdominal wall hernia or abnormality. No abdominopelvic ascites. Musculoskeletal: Degenerative changes of lumbar spine are seen. IMPRESSION: Fatty infiltration of the  liver. Hypodense lesion in the right lobe of the liver with peripheral calcifications suggestive of complicated cyst. Nonemergent ultrasound would be helpful for further evaluation. Large left renal cyst. Diverticulosis without diverticulitis. Electronically Signed   By: Alcide CleverMark  Lukens M.D.   On: 10/24/2017 19:57    EKG: Independently reviewed. Sinus tachycardia (rate 103), early R-transition.   Assessment/Plan   1. Acute left leg DVT  - Presents with swelling involving the left leg - US reveals DVT throughout the left leg; all compartments are soft and well-perfused  - Likely provoked by 24-hr bus ride one month ago; malignancy is also a concern given the indeterminate liver lesion that will need close outpatient follow-up  - He has had persistent tachycardia in ED and CTA chest was performed, negative for PE or other acute finding  - He was started on IV heparin infusion in ED and hospitalists asked to admit  - Continue IV heparin infusion, consult with case management for oral anticoagulant, needs PCP    2. Liver mass  - 2.7 cm indeterminate lesion noted incidentally in right liver  - Short-term outpatient MRI with and without contrast is recommended by radiology     DVT prophylaxis: IV heparin infusion  Code Status: Full  Family Communication: Wife updated at bedside  Consults called: None Admission status: Observation     Briscoe Deutscherimothy S Opyd, MD [facility] Pager [phone]  If 7PM-7AM, please contact night-coverage [email or web address] Password [email or web address]  10/24/2017, 10:28 PM

## 2017-10-24 NOTE — Progress Notes (Addendum)
Bilateral lower extremity venous duplex has been completed. There is evidence of acute deep vein thrombosis involving the femoral, popliteal, intramuscular gastrocnemius, posterior tibial, and peroneal veins of the left lower extremity. Negative for DVT on the right. Results were given to Dr. Rush Landmarkegeler.   10/24/17 5:53 PM Olen CordialGreg Delman Goshorn RVT

## 2017-10-24 NOTE — ED Notes (Signed)
Heparin verified Location managerwityh Crystal RN

## 2017-10-25 ENCOUNTER — Other Ambulatory Visit: Payer: Self-pay

## 2017-10-25 ENCOUNTER — Ambulatory Visit (HOSPITAL_COMMUNITY): Payer: Self-pay

## 2017-10-25 DIAGNOSIS — J189 Pneumonia, unspecified organism: Secondary | ICD-10-CM

## 2017-10-25 DIAGNOSIS — R000 Tachycardia, unspecified: Secondary | ICD-10-CM

## 2017-10-25 DIAGNOSIS — I5030 Unspecified diastolic (congestive) heart failure: Secondary | ICD-10-CM

## 2017-10-25 LAB — CBC
HEMATOCRIT: 41 % (ref 39.0–52.0)
HEMOGLOBIN: 13.3 g/dL (ref 13.0–17.0)
MCH: 31.4 pg (ref 26.0–34.0)
MCHC: 32.4 g/dL (ref 30.0–36.0)
MCV: 96.7 fL (ref 78.0–100.0)
PLATELETS: 175 10*3/uL (ref 150–400)
RBC: 4.24 MIL/uL (ref 4.22–5.81)
RDW: 14.7 % (ref 11.5–15.5)
WBC: 6.4 10*3/uL (ref 4.0–10.5)

## 2017-10-25 LAB — LACTIC ACID, PLASMA
LACTIC ACID, VENOUS: 1.4 mmol/L (ref 0.5–1.9)
Lactic Acid, Venous: 1.2 mmol/L (ref 0.5–1.9)

## 2017-10-25 LAB — ECHOCARDIOGRAM COMPLETE
HEIGHTINCHES: 68.898 in
Weight: 2864 oz

## 2017-10-25 LAB — BASIC METABOLIC PANEL
ANION GAP: 9 (ref 5–15)
BUN: <5 mg/dL — ABNORMAL LOW (ref 8–23)
CALCIUM: 8.1 mg/dL — AB (ref 8.9–10.3)
CHLORIDE: 104 mmol/L (ref 98–111)
CO2: 22 mmol/L (ref 22–32)
Creatinine, Ser: 0.83 mg/dL (ref 0.61–1.24)
GFR calc Af Amer: >60 mL/min (ref >60)
GFR calc non Af Amer: >60 mL/min (ref >60)
Glucose, Bld: 109 mg/dL — ABNORMAL HIGH (ref 70–99)
POTASSIUM: 3.6 mmol/L (ref 3.5–5.1)
Sodium: 135 mmol/L (ref 135–145)

## 2017-10-25 LAB — PROCALCITONIN: Procalcitonin: <0.1 ng/mL

## 2017-10-25 LAB — HEPARIN LEVEL (UNFRACTIONATED)
Heparin Unfractionated: 0.15 IU/mL — ABNORMAL LOW (ref 0.30–0.70)
Heparin Unfractionated: 0.42 IU/mL (ref 0.30–0.70)

## 2017-10-25 MED ORDER — SODIUM CHLORIDE 0.9 % IV SOLN
1.0000 g | INTRAVENOUS | Status: DC
  Administered 2017-10-25 – 2017-10-28 (×4): 1 g via INTRAVENOUS
  Filled 2017-10-25 (×5): qty 10

## 2017-10-25 MED ORDER — SODIUM CHLORIDE 0.9 % IV SOLN
500.0000 mg | INTRAVENOUS | Status: DC
  Administered 2017-10-25 – 2017-10-26 (×2): 500 mg via INTRAVENOUS
  Filled 2017-10-25 (×3): qty 500

## 2017-10-25 MED ORDER — SODIUM CHLORIDE 0.9 % IV SOLN
INTRAVENOUS | Status: DC
  Administered 2017-10-25 (×2): via INTRAVENOUS

## 2017-10-25 MED ORDER — SODIUM CHLORIDE 0.9 % IV BOLUS
500.0000 mL | Freq: Once | INTRAVENOUS | Status: AC
  Administered 2017-10-25: 500 mL via INTRAVENOUS

## 2017-10-25 NOTE — Progress Notes (Addendum)
Progress Note    Travis Carr  [id number] DOB: 07/14/1940  DOA: 10/24/2017 PCP: Patient, No Pcp Per    Brief Narrative:     Medical records reviewed and are as summarized below:  Travis Carr is an 77 y.o. male with medical history significant for BPH, remote cholecystectomy, and abdominal hernia repair several months ago, now presenting to the emergency department for evaluation of left leg swelling and abdominal fullness.  Patient reports that he underwent repair of an abdominal hernia several months ago, had return to his usual state, but then noted some vague abdominal discomfort and sensation of "fullness" over the past 2 months without any significant nausea, vomiting, or diarrhea.  He had mild edema involving both legs approximately 2 months ago, was also complaining of bilateral knee pain at the time, was started on prednisone and Bumex by his physician in [place].  The pain resolved and swelling improved significantly, but the left leg became swollen again within the past month, beginning after a 24-hour bus ride from [place].  Patient denies any chest pain, cough, or shortness of breath.  He denies any history of melena, hematochezia, easy bruising or bleeding, or head bleed.  Denies frequent falls.  Denies any history of cancer.  Assessment/Plan:   Principal Problem:   Leg DVT (deep venous thromboembolism), acute, left (HCC) Active Problems:   Liver mass, right lobe  CAP -suggestive on chest x-ray -fever -IV abx -blood cultures -check lactic acid -check pro-calcitonin -incentive spirometry  N/V -change to liquid diet -IV zofran -not clear etiology -? Gastritis as was on recent prednisone for unknown reason  Hypotension -IVF  Lower ext edema -echo ordered  Acute DVT -IV heparin, change to PO once able-- no insurance so care management consulted -CTA negative for PE  Liver mass -outpatient MRI   Patient appears sick, concern for  decompensation.  Will need to monitor closely.  Labs and imaging ordered.    Family Communication/Anticipated D/C date and plan/Code Status   DVT prophylaxis: heparin gtt Code Status: Full Code.  Family Communication: at bedside with video interpreter Disposition Plan: pending work up   Medical Consultants:    None.     Subjective:   Nausea and vomiting earlier  Objective:    Vitals:   10/25/17 0821 10/25/17 1234 10/25/17 1235 10/25/17 1241  BP: 106/63  (!) 91/57 (!) 109/91  Pulse:    (!) 112  Resp:   (!) 39 (!) 31  Temp: 98.4 F (36.9 C) 99 F (37.2 C)  98.8 F (37.1 C)  TempSrc: Oral Oral  Oral  SpO2:   93% 93%  Weight:      Height:        Intake/Output Summary (Last 24 hours) at 10/25/2017 1242 Last data filed at 10/25/2017 0400 Gross per 24 hour  Intake 110.95 ml  Output -  Net 110.95 ml   Filed Weights   10/24/17 2100  Weight: 81.2 kg    Exam: In bed, ill appearing Rapid breathing Tachy +LE edema +BS, NT   Data Reviewed:   I have personally reviewed following labs and imaging studies:  Labs: Labs show the following:   Basic Metabolic Panel: Recent Labs  Lab 10/24/17 1425 10/24/17 1727 10/25/17 0320  NA 138  --  135  K 3.7  --  3.6  CL 105  --  104  CO2 22  --  22  GLUCOSE 127*  --  109*  BUN 5*  --  <  5*  CREATININE 1.01  --  0.83  CALCIUM 8.7*  --  8.1*  MG  --  1.9  --    GFR Estimated Creatinine Clearance: 77.9 mL/min (by C-G formula based on SCr of 0.83 mg/dL). Liver Function Tests: Recent Labs  Lab 10/24/17 1425  AST 46*  ALT 41  ALKPHOS 73  BILITOT 1.2  PROT 5.9*  ALBUMIN 3.0*   Recent Labs  Lab 10/24/17 1425  LIPASE 26   Recent Labs  Lab 10/24/17 1735  AMMONIA 18   Coagulation profile Recent Labs  Lab 10/24/17 1727  INR 1.02    CBC: Recent Labs  Lab 10/24/17 1425 10/25/17 0320  WBC 7.3 6.4  HGB 14.2 13.3  HCT 44.7 41.0  MCV 99.8 96.7  PLT 187 175   Cardiac Enzymes: No results for  input(s): CKTOTAL, CKMB, CKMBINDEX, TROPONINI in the last 168 hours. BNP (last 3 results) No results for input(s): PROBNP in the last 8760 hours. CBG: No results for input(s): GLUCAP in the last 168 hours. D-Dimer: No results for input(s): DDIMER in the last 72 hours. Hgb A1c: No results for input(s): HGBA1C in the last 72 hours. Lipid Profile: No results for input(s): CHOL, HDL, LDLCALC, TRIG, CHOLHDL, LDLDIRECT in the last 72 hours. Thyroid function studies: Recent Labs    10/24/17 1727  TSH 2.204   Anemia work up: No results for input(s): VITAMINB12, FOLATE, FERRITIN, TIBC, IRON, RETICCTPCT in the last 72 hours. Sepsis Labs: Recent Labs  Lab 10/24/17 1425 10/25/17 0320  WBC 7.3 6.4    Microbiology No results found for this or any previous visit (from the past 240 hour(s)).  Procedures and diagnostic studies:  Dg Chest 2 View  Result Date: 10/24/2017 CLINICAL DATA:  Shortness of breath EXAM: CHEST - 2 VIEW COMPARISON:  None. FINDINGS: There is patchy opacity in the right middle lobe. There is slight atelectasis in the left base. Lungs elsewhere clear. Heart size and pulmonary vascularity are normal. No adenopathy. No bone lesions. IMPRESSION: Patchy opacity in the right middle lobe consistent with a degree of pneumonia and atelectasis. Mild atelectasis left base. Lungs elsewhere clear. Heart size normal. Electronically Signed   By: Bretta Bang III M.D.   On: 10/24/2017 16:58   Ct Angio Chest Pe W And/or Wo Contrast  Result Date: 10/24/2017 CLINICAL DATA:  Exertional dyspnea. Reported left lower extremity DVT. EXAM: CT ANGIOGRAPHY CHEST WITH CONTRAST TECHNIQUE: Multidetector CT imaging of the chest was performed using the standard protocol during bolus administration of intravenous contrast. Multiplanar CT image reconstructions and MIPs were obtained to evaluate the vascular anatomy. CONTRAST:  80mL ISOVUE-370 IOPAMIDOL (ISOVUE-370) INJECTION 76% COMPARISON:  Chest  radiograph from earlier today. FINDINGS: Cardiovascular: The study is high quality for the evaluation of pulmonary embolism. There are no filling defects in the central, lobar, segmental or subsegmental pulmonary artery branches to suggest acute pulmonary embolism. Mildly atherosclerotic nonaneurysmal thoracic aorta. Normal caliber pulmonary arteries. Normal heart size. No significant pericardial fluid/thickening. Mediastinum/Nodes: No discrete thyroid nodules. Unremarkable esophagus. No pathologically enlarged axillary, mediastinal or hilar lymph nodes. Lungs/Pleura: No pneumothorax. No pleural effusion. No acute consolidative airspace disease, lung masses or significant pulmonary nodules. Parenchymal bands in the lower lobes could represent scarring or mild atelectasis. Upper abdomen: Hypodense 2.7 cm inferior right liver mass (series 5/image 110). Musculoskeletal: No aggressive appearing focal osseous lesions. Mild thoracic spondylosis. Review of the MIP images confirms the above findings. IMPRESSION: 1. No pulmonary embolism. 2. Mild scarring versus atelectasis in the  lower lobes. 3. Indeterminate hypodense 2.7 cm inferior right liver mass. MRI abdomen without and with IV contrast recommended for further characterization, which may be performed on a short term outpatient basis. Aortic Atherosclerosis (ICD10-I70.0). Electronically Signed   By: Delbert PhenixJason A Poff M.D.   On: 10/24/2017 20:18   Ct Abdomen Pelvis W Contrast  Result Date: 10/24/2017 CLINICAL DATA:  History of recent cholecystectomy with abdominal pain, initial encounter EXAM: CT ABDOMEN AND PELVIS WITH CONTRAST TECHNIQUE: Multidetector CT imaging of the abdomen and pelvis was performed using the standard protocol following bolus administration of intravenous contrast. CONTRAST:  100mL OMNIPAQUE IOHEXOL 300 MG/ML  SOLN COMPARISON:  None. FINDINGS: Lower chest: Within normal limits. Hepatobiliary: Gallbladder has been surgically removed. No postsurgical  fluid collection is identified. Fatty infiltration of the liver is noted. Hypodense lesion is noted measuring 2.8 cm in the right lobe of the liver laterally. It shows changes of peripheral calcification and may represent a complicated cyst. Nonemergent ultrasound may be helpful for further evaluation. Pancreas: Unremarkable. No pancreatic ductal dilatation or surrounding inflammatory changes. Spleen: Normal in size without focal abnormality. Adrenals/Urinary Tract: Adrenal glands are within normal limits. Kidneys are well visualized bilaterally. Excretion of contrast is noted. No obstructive changes are seen. A large left renal cyst is noted measuring 10.6 cm. Bladder is partially distended. Stomach/Bowel: Scattered diverticular change of the colon is noted without evidence of diverticulitis. No obstructive or inflammatory changes are seen. The appendix is not well appreciated and may have been surgically removed. No inflammatory changes are seen. No small bowel abnormality is noted. Stomach is within normal limits. Vascular/Lymphatic: Aortic atherosclerosis. No enlarged abdominal or pelvic lymph nodes. Reproductive: Prostate is unremarkable. Other: No abdominal wall hernia or abnormality. No abdominopelvic ascites. Musculoskeletal: Degenerative changes of lumbar spine are seen. IMPRESSION: Fatty infiltration of the liver. Hypodense lesion in the right lobe of the liver with peripheral calcifications suggestive of complicated cyst. Nonemergent ultrasound would be helpful for further evaluation. Large left renal cyst. Diverticulosis without diverticulitis. Electronically Signed   By: Alcide CleverMark  Lukens M.D.   On: 10/24/2017 19:57    Medications:   . finasteride  5 mg Oral QHS  . multivitamin with minerals  1 tablet Oral Daily  . pantoprazole  40 mg Oral Daily  . sodium chloride flush  3 mL Intravenous Q12H  . sodium chloride flush  3 mL Intravenous Q12H  . tamsulosin  0.4 mg Oral QHS   Continuous Infusions: .  sodium chloride    . azithromycin    . cefTRIAXone (ROCEPHIN)  IV    . heparin 1,550 Units/hr (10/25/17 1233)     LOS: 0 days   Joseph ArtJessica U Sarim Rothman  [facility]   *Please refer to [email or web address], password [email or web address] to get updated schedule on who will round on this patient, as hospitalists switch teams weekly. If 7PM-7AM, please contact night-coverage at [email or web address], password [email or web address] for any overnight needs.  10/25/2017, 12:42 PM

## 2017-10-25 NOTE — Progress Notes (Signed)
ANTICOAGULATION CONSULT NOTE   Pharmacy Consult for Heparin Indication: DVT  No Known Allergies  Patient Measurements: Ideal Body weight: 68 kg Heparin Dosing Weight: 81 kg   Vital Signs: Temp: 98.8 F (37.1 C) (08/14 1241) Temp Source: Oral (08/14 1241) BP: 109/91 (08/14 1241) Pulse Rate: 112 (08/14 1241)  Labs: Recent Labs    10/24/17 1425 10/24/17 1727 10/25/17 0320 10/25/17 0519 10/25/17 1522  HGB 14.2  --  13.3  --   --   HCT 44.7  --  41.0  --   --   PLT 187  --  175  --   --   LABPROT  --  13.3  --   --   --   INR  --  1.02  --   --   --   HEPARINUNFRC  --   --   --  0.15* 0.42  CREATININE 1.01  --  0.83  --   --     Estimated Creatinine Clearance: 77.9 mL/min (by C-G formula based on SCr of 0.83 mg/dL).   Medical History: Past Medical History:  Diagnosis Date  . BPH (benign prostatic hyperplasia)     Medications:  Medications Prior to Admission  Medication Sig Dispense Refill Last Dose  . acetaminophen (TYLENOL) 650 MG CR tablet Take 650 mg by mouth every 8 (eight) hours as needed for pain.   10/24/2017 at Unknown time  . bumetanide (BUMEX) 1 MG tablet Take 1 mg by mouth daily.   Past Week at Unknown time  . finasteride (PROSCAR) 5 MG tablet Take 5 mg by mouth at bedtime.   10/23/2017 at Unknown time  . Loratadine-Pseudoephedrine (CLARITIN-D 12 HOUR PO) Take 1 tablet by mouth 2 (two) times daily.   Past Week at Unknown time  . Multiple Vitamin (MULTIVITAMIN WITH MINERALS) TABS tablet Take 1 tablet by mouth daily.   Past Week at Unknown time  . NON FORMULARY Take 4 mg by mouth at bedtime. Supra lidamidia   Past Week at Unknown time  . NON FORMULARY Take 1 tablet by mouth 3 (three) times daily. Espaven Enzimatico   Past Week at Unknown time  . NON FORMULARY Apply 1 application topically daily as needed (allergic reaction). Barmacil Compuesto (betametasone, clotrimazole, gentamicin 50mg )   Past Month at Unknown time  . pantoprazole (PROTONIX) 40 MG tablet  Take 40 mg by mouth daily.   Past Week at Unknown time  . predniSONE (DELTASONE) 50 MG tablet Take 50 mg by mouth daily with breakfast.   Past Week at Unknown time  . tamsulosin (FLOMAX) 0.4 MG CAPS capsule Take 0.4 mg by mouth at bedtime.   10/23/2017 at Unknown time    Assessment: [age] YOM who presents with peripheral edema, fatigue, shortness of breath and abdominal pain. He was found to have acute LE DVT. CT angio did not show a PE. Pharmacy consulted to start IV heparin. Patient is not on any anticoagulation prior to admission. H/H and Plt wnl. SCr 1.01.   Heparin level this afternoon is now at goal (0.42), no bleeding issues noted. Continue heparin at 1550 units/hr for now. PO anticoagulation when able.   Goal of Therapy:  Heparin level 0.3-0.7 units/ml Monitor platelets by anticoagulation protocol: Yes   Plan:  Continue heparin at 1550 units/hr Daily heparin level and cbc  Sheppard CoilFrank Brailee Riede PharmD., BCPS Clinical Pharmacist 10/25/2017 5:20 PM

## 2017-10-25 NOTE — Progress Notes (Signed)
*  PRELIMINARY RESULTS* Echocardiogram 2D Echocardiogram has been performed.  Jeryl Columbialliott, Govind Furey 10/25/2017, 2:24 PM

## 2017-10-25 NOTE — Progress Notes (Signed)
ANTICOAGULATION CONSULT NOTE   Pharmacy Consult for Heparin Indication: DVT  No Known Allergies  Patient Measurements: Ideal Body weight: 68 kg Heparin Dosing Weight: 81 kg   Vital Signs: Temp: 100.5 F (38.1 C) (08/14 0514) Temp Source: Oral (08/14 0514) BP: 100/67 (08/14 0514) Pulse Rate: 107 (08/13 2230)  Labs: Recent Labs    10/24/17 1425 10/24/17 1727 10/25/17 0320 10/25/17 0519  HGB 14.2  --  13.3  --   HCT 44.7  --  41.0  --   PLT 187  --  175  --   LABPROT  --  13.3  --   --   INR  --  1.02  --   --   HEPARINUNFRC  --   --   --  0.15*  CREATININE 1.01  --  0.83  --     Estimated Creatinine Clearance: 77.9 mL/min (by C-G formula based on SCr of 0.83 mg/dL).   Medical History: Past Medical History:  Diagnosis Date  . BPH (benign prostatic hyperplasia)     Medications:  Medications Prior to Admission  Medication Sig Dispense Refill Last Dose  . acetaminophen (TYLENOL) 650 MG CR tablet Take 650 mg by mouth every 8 (eight) hours as needed for pain.   10/24/2017 at Unknown time  . bumetanide (BUMEX) 1 MG tablet Take 1 mg by mouth daily.   Past Week at Unknown time  . finasteride (PROSCAR) 5 MG tablet Take 5 mg by mouth at bedtime.   10/23/2017 at Unknown time  . Loratadine-Pseudoephedrine (CLARITIN-D 12 HOUR PO) Take 1 tablet by mouth 2 (two) times daily.   Past Week at Unknown time  . Multiple Vitamin (MULTIVITAMIN WITH MINERALS) TABS tablet Take 1 tablet by mouth daily.   Past Week at Unknown time  . NON FORMULARY Take 4 mg by mouth at bedtime. Supra lidamidia   Past Week at Unknown time  . NON FORMULARY Take 1 tablet by mouth 3 (three) times daily. Espaven Enzimatico   Past Week at Unknown time  . NON FORMULARY Apply 1 application topically daily as needed (allergic reaction). Barmacil Compuesto (betametasone, clotrimazole, gentamicin 50mg )   Past Month at Unknown time  . pantoprazole (PROTONIX) 40 MG tablet Take 40 mg by mouth daily.   Past Week at Unknown  time  . predniSONE (DELTASONE) 50 MG tablet Take 50 mg by mouth daily with breakfast.   Past Week at Unknown time  . tamsulosin (FLOMAX) 0.4 MG CAPS capsule Take 0.4 mg by mouth at bedtime.   10/23/2017 at Unknown time    Assessment: 89 YOM who presents with peripheral edema, fatigue, shortness of breath and abdominal pain. He was found to have acute LE DVT. CT angio did not show a PE. Pharmacy consulted to start IV heparin. Patient is not on any anticoagulation prior to admission. H/H and Plt wnl. SCr 1.01.   8/14 AM update: heparin level low this AM, CBC ok  Goal of Therapy:  Heparin level 0.3-0.7 units/ml Monitor platelets by anticoagulation protocol: Yes   Plan:  Inc heparin to 1550 units/hr 1500 HL  Abran DukeJames Wagner Tanzi, PharmD, BCPS Clinical Pharmacist Phone: [phone]

## 2017-10-26 ENCOUNTER — Inpatient Hospital Stay (HOSPITAL_COMMUNITY): Payer: Self-pay

## 2017-10-26 DIAGNOSIS — R221 Localized swelling, mass and lump, neck: Secondary | ICD-10-CM

## 2017-10-26 LAB — COMPREHENSIVE METABOLIC PANEL
ALT: 38 U/L (ref 0–44)
AST: 37 U/L (ref 15–41)
Albumin: 2.3 g/dL — ABNORMAL LOW (ref 3.5–5.0)
Alkaline Phosphatase: 63 U/L (ref 38–126)
Anion gap: 6 (ref 5–15)
BUN: 6 mg/dL — AB (ref 8–23)
CHLORIDE: 110 mmol/L (ref 98–111)
CO2: 22 mmol/L (ref 22–32)
Calcium: 7.5 mg/dL — ABNORMAL LOW (ref 8.9–10.3)
Creatinine, Ser: 0.97 mg/dL (ref 0.61–1.24)
Glucose, Bld: 128 mg/dL — ABNORMAL HIGH (ref 70–99)
POTASSIUM: 3.4 mmol/L — AB (ref 3.5–5.1)
SODIUM: 138 mmol/L (ref 135–145)
Total Bilirubin: 1.2 mg/dL (ref 0.3–1.2)
Total Protein: 4.5 g/dL — ABNORMAL LOW (ref 6.5–8.1)

## 2017-10-26 LAB — CBC
HCT: 37.6 % — ABNORMAL LOW (ref 39.0–52.0)
Hemoglobin: 12.1 g/dL — ABNORMAL LOW (ref 13.0–17.0)
MCH: 31.6 pg (ref 26.0–34.0)
MCHC: 32.2 g/dL (ref 30.0–36.0)
MCV: 98.2 fL (ref 78.0–100.0)
PLATELETS: 175 10*3/uL (ref 150–400)
RBC: 3.83 MIL/uL — AB (ref 4.22–5.81)
RDW: 14.9 % (ref 11.5–15.5)
WBC: 5 10*3/uL (ref 4.0–10.5)

## 2017-10-26 LAB — URINE CULTURE

## 2017-10-26 LAB — HEPARIN LEVEL (UNFRACTIONATED): Heparin Unfractionated: 0.53 IU/mL (ref 0.30–0.70)

## 2017-10-26 MED ORDER — POTASSIUM CHLORIDE CRYS ER 20 MEQ PO TBCR
40.0000 meq | EXTENDED_RELEASE_TABLET | Freq: Once | ORAL | Status: AC
  Administered 2017-10-26: 40 meq via ORAL
  Filled 2017-10-26: qty 2

## 2017-10-26 MED ORDER — IOHEXOL 300 MG/ML  SOLN
75.0000 mL | Freq: Once | INTRAMUSCULAR | Status: AC | PRN
  Administered 2017-10-26: 75 mL via INTRAVENOUS

## 2017-10-26 NOTE — Progress Notes (Signed)
ANTICOAGULATION CONSULT NOTE   Pharmacy Consult for Heparin Indication: DVT  No Known Allergies  Patient Measurements: Ideal Body weight: 68 kg Heparin Dosing Weight: 81 kg   Vital Signs:    Labs: Recent Labs    10/24/17 1425 10/24/17 1727 10/25/17 0320 10/25/17 0519 10/25/17 1522 10/26/17 0323 10/26/17 0327  HGB 14.2  --  13.3  --   --   --  12.1*  HCT 44.7  --  41.0  --   --   --  37.6*  PLT 187  --  175  --   --   --  175  LABPROT  --  13.3  --   --   --   --   --   INR  --  1.02  --   --   --   --   --   HEPARINUNFRC  --   --   --  0.15* 0.42 0.53  --   CREATININE 1.01  --  0.83  --   --   --  0.97    Estimated Creatinine Clearance: 66.6 mL/min (by C-G formula based on SCr of 0.97 mg/dL).   Medical History: Past Medical History:  Diagnosis Date  . BPH (benign prostatic hyperplasia)     Medications:  Medications Prior to Admission  Medication Sig Dispense Refill Last Dose  . acetaminophen (TYLENOL) 650 MG CR tablet Take 650 mg by mouth every 8 (eight) hours as needed for pain.   10/24/2017 at Unknown time  . bumetanide (BUMEX) 1 MG tablet Take 1 mg by mouth daily.   Past Week at Unknown time  . finasteride (PROSCAR) 5 MG tablet Take 5 mg by mouth at bedtime.   10/23/2017 at Unknown time  . Loratadine-Pseudoephedrine (CLARITIN-D 12 HOUR PO) Take 1 tablet by mouth 2 (two) times daily.   Past Week at Unknown time  . Multiple Vitamin (MULTIVITAMIN WITH MINERALS) TABS tablet Take 1 tablet by mouth daily.   Past Week at Unknown time  . NON FORMULARY Take 4 mg by mouth at bedtime. Supra lidamidia   Past Week at Unknown time  . NON FORMULARY Take 1 tablet by mouth 3 (three) times daily. Espaven Enzimatico   Past Week at Unknown time  . NON FORMULARY Apply 1 application topically daily as needed (allergic reaction). Barmacil Compuesto (betametasone, clotrimazole, gentamicin 50mg )   Past Month at Unknown time  . pantoprazole (PROTONIX) 40 MG tablet Take 40 mg by mouth  daily.   Past Week at Unknown time  . predniSONE (DELTASONE) 50 MG tablet Take 50 mg by mouth daily with breakfast.   Past Week at Unknown time  . tamsulosin (FLOMAX) 0.4 MG CAPS capsule Take 0.4 mg by mouth at bedtime.   10/23/2017 at Unknown time    Assessment: [age] YOM who presents with peripheral edema, fatigue, shortness of breath and abdominal pain. He was found to have acute LE DVT. CT angio did not show a PE. Pharmacy consulted to start IV heparin. Patient is not on any anticoagulation prior to admission.  Heparin level this afternoon is at goal, no bleeding issues documented. Hg down to 12.1, plt wnl.  Goal of Therapy:  Heparin level 0.3-0.7 units/ml Monitor platelets by anticoagulation protocol: Yes   Plan:  Continue heparin at 1550 units/hr Monitor daily heparin level and CBC, s/sx bleeding F/u transition to PO anticoagulation as appropriate, case management consulted as no insurance  Babs BertinHaley Lexianna Weinrich, PharmD, BCPS Clinical Pharmacist Clinical phone [phone] Please check AMION  for all [facility] contact numbers 10/26/2017 10:12 AM

## 2017-10-26 NOTE — Plan of Care (Signed)
  Problem: Clinical Measurements: Goal: Respiratory complications will improve Outcome: Progressing   Problem: Coping: Goal: Level of anxiety will decrease Outcome: Progressing   

## 2017-10-26 NOTE — Progress Notes (Signed)
Progress Note    Travis JockHector Rico Westra  RUE:[id number]:[id number] DOB: 10/03/1940  DOA: 10/24/2017 PCP: Patient, No Pcp Per    Brief Narrative:     Medical records reviewed and are as summarized below:  Travis Carr is an 77 y.o. male with medical history significant for BPH, remote cholecystectomy, and abdominal hernia repair several months ago, now presenting to the emergency department for evaluation of left leg swelling.  Found to have DVT- most likely provoked.    Assessment/Plan:   Principal Problem:   Leg DVT (deep venous thromboembolism), acute, left (HCC) Active Problems:   Liver mass, right lobe   Acute DVT (deep venous thrombosis) (HCC)  CAP -suggestive on chest x-ray -fever -IV abx-- patient has improved -blood cultures pending -incentive spirometry  Fullness in neck- presented suddenly per wife -no trouble breathing -no difficulty swallowing -no palpable nodes -CT scan of neck -in [place] on medications that wife thinks was prednisone but did not get better -? Adipose tissue  N/V- resolved -IV zofran -advanced to soft diet  Hypotension -IVF  Hypokalemia -replete  Lower ext edema -left from DVT but also has some insufficiency as well  Acute DVT -IV heparin, change to PO once able-- no insurance so care management consulted for options -CTA negative for PE  Liver mass -outpatient MRI  Patient much improved today.  Breathing and HR slowed after initiation of IV abx   Family Communication/Anticipated D/C date and plan/Code Status   DVT prophylaxis: heparin gtt Code Status: Full Code.  Family Communication: at bedside with video interpreter Disposition Plan: pending work up   Medical Consultants:    None.     Subjective:   Feeling better this AM Less nausea  Objective:    Vitals:   10/25/17 1234 10/25/17 1235 10/25/17 1241 10/25/17 2039  BP:  (!) 91/57 (!) 109/91 100/66  Pulse:   (!) 112   Resp:  (!) 39 (!) 31 (!)  25  Temp: 99 F (37.2 C)  98.8 F (37.1 C) 98.2 F (36.8 C)  TempSrc: Oral  Oral Oral  SpO2:  93% 93% 94%  Weight:      Height:        Intake/Output Summary (Last 24 hours) at 10/26/2017 1202 Last data filed at 10/26/2017 0900 Gross per 24 hour  Intake 626.04 ml  Output -  Net 626.04 ml   Filed Weights   10/24/17 2100  Weight: 81.2 kg    Exam: In bed, NAD No increased work of breathing, no wheezing rrr +BS, soft L>R LE edema   Data Reviewed:   I have personally reviewed following labs and imaging studies:  Labs: Labs show the following:   Basic Metabolic Panel: Recent Labs  Lab 10/24/17 1425 10/24/17 1727 10/25/17 0320 10/26/17 0327  NA 138  --  135 138  K 3.7  --  3.6 3.4*  CL 105  --  104 110  CO2 22  --  22 22  GLUCOSE 127*  --  109* 128*  BUN 5*  --  <5* 6*  CREATININE 1.01  --  0.83 0.97  CALCIUM 8.7*  --  8.1* 7.5*  MG  --  1.9  --   --    GFR Estimated Creatinine Clearance: 66.6 mL/min (by C-G formula based on SCr of 0.97 mg/dL). Liver Function Tests: Recent Labs  Lab 10/24/17 1425 10/26/17 0327  AST 46* 37  ALT 41 38  ALKPHOS 73 63  BILITOT 1.2 1.2  PROT 5.9* 4.5*  ALBUMIN 3.0* 2.3*   Recent Labs  Lab 10/24/17 1425  LIPASE 26   Recent Labs  Lab 10/24/17 1735  AMMONIA 18   Coagulation profile Recent Labs  Lab 10/24/17 1727  INR 1.02    CBC: Recent Labs  Lab 10/24/17 1425 10/25/17 0320 10/26/17 0327  WBC 7.3 6.4 5.0  HGB 14.2 13.3 12.1*  HCT 44.7 41.0 37.6*  MCV 99.8 96.7 98.2  PLT 187 175 175   Cardiac Enzymes: No results for input(s): CKTOTAL, CKMB, CKMBINDEX, TROPONINI in the last 168 hours. BNP (last 3 results) No results for input(s): PROBNP in the last 8760 hours. CBG: No results for input(s): GLUCAP in the last 168 hours. D-Dimer: No results for input(s): DDIMER in the last 72 hours. Hgb A1c: No results for input(s): HGBA1C in the last 72 hours. Lipid Profile: No results for input(s): CHOL, HDL,  LDLCALC, TRIG, CHOLHDL, LDLDIRECT in the last 72 hours. Thyroid function studies: Recent Labs    10/24/17 1727  TSH 2.204   Anemia work up: No results for input(s): VITAMINB12, FOLATE, FERRITIN, TIBC, IRON, RETICCTPCT in the last 72 hours. Sepsis Labs: Recent Labs  Lab 10/24/17 1425 10/25/17 0320 10/25/17 1250 10/25/17 1522 10/26/17 0327  PROCALCITON  --   --  <0.10  --   --   WBC 7.3 6.4  --   --  5.0  LATICACIDVEN  --   --  1.4 1.2  --     Microbiology Recent Results (from the past 240 hour(s))  Urine culture     Status: Abnormal   Collection Time: 10/24/17  7:12 PM  Result Value Ref Range Status   Specimen Description URINE, CLEAN CATCH  Final   Special Requests NONE  Final   Culture (A)  Final    20,000 COLONIES/mL CORYNEBACTERIUM SPECIES Standardized susceptibility testing for this organism is not available. Performed at [facility], [address] [place], [place] [address]    Report Status 10/26/2017 FINAL  Final    Procedures and diagnostic studies:  Dg Chest 2 View  Result Date: 10/24/2017 CLINICAL DATA:  Shortness of breath EXAM: CHEST - 2 VIEW COMPARISON:  None. FINDINGS: There is patchy opacity in the right middle lobe. There is slight atelectasis in the left base. Lungs elsewhere clear. Heart size and pulmonary vascularity are normal. No adenopathy. No bone lesions. IMPRESSION: Patchy opacity in the right middle lobe consistent with a degree of pneumonia and atelectasis. Mild atelectasis left base. Lungs elsewhere clear. Heart size normal. Electronically Signed   By: Bretta BangWilliam  Woodruff III M.D.   On: 10/24/2017 16:58   Ct Angio Chest Pe W And/or Wo Contrast  Result Date: 10/24/2017 CLINICAL DATA:  Exertional dyspnea. Reported left lower extremity DVT. EXAM: CT ANGIOGRAPHY CHEST WITH CONTRAST TECHNIQUE: Multidetector CT imaging of the chest was performed using the standard protocol during bolus administration of intravenous contrast. Multiplanar CT  image reconstructions and MIPs were obtained to evaluate the vascular anatomy. CONTRAST:  80mL ISOVUE-370 IOPAMIDOL (ISOVUE-370) INJECTION 76% COMPARISON:  Chest radiograph from earlier today. FINDINGS: Cardiovascular: The study is high quality for the evaluation of pulmonary embolism. There are no filling defects in the central, lobar, segmental or subsegmental pulmonary artery branches to suggest acute pulmonary embolism. Mildly atherosclerotic nonaneurysmal thoracic aorta. Normal caliber pulmonary arteries. Normal heart size. No significant pericardial fluid/thickening. Mediastinum/Nodes: No discrete thyroid nodules. Unremarkable esophagus. No pathologically enlarged axillary, mediastinal or hilar lymph nodes. Lungs/Pleura: No pneumothorax. No pleural effusion. No  acute consolidative airspace disease, lung masses or significant pulmonary nodules. Parenchymal bands in the lower lobes could represent scarring or mild atelectasis. Upper abdomen: Hypodense 2.7 cm inferior right liver mass (series 5/image 110). Musculoskeletal: No aggressive appearing focal osseous lesions. Mild thoracic spondylosis. Review of the MIP images confirms the above findings. IMPRESSION: 1. No pulmonary embolism. 2. Mild scarring versus atelectasis in the lower lobes. 3. Indeterminate hypodense 2.7 cm inferior right liver mass. MRI abdomen without and with IV contrast recommended for further characterization, which may be performed on a short term outpatient basis. Aortic Atherosclerosis (ICD10-I70.0). Electronically Signed   By: Delbert Phenix M.D.   On: 10/24/2017 20:18   Ct Abdomen Pelvis W Contrast  Result Date: 10/24/2017 CLINICAL DATA:  History of recent cholecystectomy with abdominal pain, initial encounter EXAM: CT ABDOMEN AND PELVIS WITH CONTRAST TECHNIQUE: Multidetector CT imaging of the abdomen and pelvis was performed using the standard protocol following bolus administration of intravenous contrast. CONTRAST:  OMNIPAQUE  IOHEXOL 300 MG/ML  SOLN COMPARISON:  None. FINDINGS: Lower chest: Within normal limits. Hepatobiliary: Gallbladder has been surgically removed. No postsurgical fluid collection is identified. Fatty infiltration of the liver is noted. Hypodense lesion is noted measuring 2.8 cm in the right lobe of the liver laterally. It shows changes of peripheral calcification and may represent a complicated cyst. Nonemergent ultrasound may be helpful for further evaluation. Pancreas: Unremarkable. No pancreatic ductal dilatation or surrounding inflammatory changes. Spleen: Normal in size without focal abnormality. Adrenals/Urinary Tract: Adrenal glands are within normal limits. Kidneys are well visualized bilaterally. Excretion of contrast is noted. No obstructive changes are seen. A large left renal cyst is noted measuring 10.6 cm. Bladder is partially distended. Stomach/Bowel: Scattered diverticular change of the colon is noted without evidence of diverticulitis. No obstructive or inflammatory changes are seen. The appendix is not well appreciated and may have been surgically removed. No inflammatory changes are seen. No small bowel abnormality is noted. Stomach is within normal limits. Vascular/Lymphatic: Aortic atherosclerosis. No enlarged abdominal or pelvic lymph nodes. Reproductive: Prostate is unremarkable. Other: No abdominal wall hernia or abnormality. No abdominopelvic ascites. Musculoskeletal: Degenerative changes of lumbar spine are seen. IMPRESSION: Fatty infiltration of the liver. Hypodense lesion in the right lobe of the liver with peripheral calcifications suggestive of complicated cyst. Nonemergent ultrasound would be helpful for further evaluation. Large left renal cyst. Diverticulosis without diverticulitis. Electronically Signed   By: Alcide Clever M.D.   On: 10/24/2017 19:57    Medications:   . finasteride  5 mg Oral QHS  . multivitamin with minerals  1 tablet Oral Daily  . pantoprazole  40 mg Oral  Daily  . sodium chloride flush  3 mL Intravenous Q12H  . sodium chloride flush  3 mL Intravenous Q12H  . tamsulosin  0.4 mg Oral QHS   Continuous Infusions: . sodium chloride    . sodium chloride 75 mL/hr at 10/25/17 2004  . azithromycin 500 mg (10/25/17 1315)  . cefTRIAXone (ROCEPHIN)  IV 1 g (10/26/17 1157)  . heparin 1,550 Units/hr (10/26/17 0353)     LOS: 1 day   Joseph Art  [facility]   *Please refer to [email or web address], password [email or web address] to get updated schedule on who will round on this patient, as hospitalists switch teams weekly. If 7PM-7AM, please contact night-coverage at [email or web address], password [email or web address] for any overnight needs.  10/26/2017, 12:02 PM

## 2017-10-27 LAB — HEPARIN LEVEL (UNFRACTIONATED): Heparin Unfractionated: 0.33 IU/mL (ref 0.30–0.70)

## 2017-10-27 LAB — CBC
HCT: 37.6 % — ABNORMAL LOW (ref 39.0–52.0)
HEMOGLOBIN: 11.8 g/dL — AB (ref 13.0–17.0)
MCH: 31.4 pg (ref 26.0–34.0)
MCHC: 31.4 g/dL (ref 30.0–36.0)
MCV: 100 fL (ref 78.0–100.0)
PLATELETS: 164 10*3/uL (ref 150–400)
RBC: 3.76 MIL/uL — AB (ref 4.22–5.81)
RDW: 14.9 % (ref 11.5–15.5)
WBC: 5.8 10*3/uL (ref 4.0–10.5)

## 2017-10-27 LAB — BASIC METABOLIC PANEL
ANION GAP: 7 (ref 5–15)
BUN: <5 mg/dL — ABNORMAL LOW (ref 8–23)
CO2: 20 mmol/L — ABNORMAL LOW (ref 22–32)
Calcium: 7.6 mg/dL — ABNORMAL LOW (ref 8.9–10.3)
Chloride: 110 mmol/L (ref 98–111)
Creatinine, Ser: 0.88 mg/dL (ref 0.61–1.24)
GFR calc Af Amer: >60 mL/min (ref >60)
Glucose, Bld: 106 mg/dL — ABNORMAL HIGH (ref 70–99)
POTASSIUM: 3.8 mmol/L (ref 3.5–5.1)
Sodium: 137 mmol/L (ref 135–145)

## 2017-10-27 MED ORDER — APIXABAN 5 MG PO TABS
10.0000 mg | ORAL_TABLET | Freq: Two times a day (BID) | ORAL | Status: DC
  Administered 2017-10-27 – 2017-10-29 (×5): 10 mg via ORAL
  Filled 2017-10-27 (×5): qty 2

## 2017-10-27 MED ORDER — APIXABAN 5 MG PO TABS: 10.0000 mg | ORAL_TABLET | Freq: Two times a day (BID) | ORAL | Status: DC

## 2017-10-27 MED ORDER — APIXABAN 5 MG PO TABS: 5.0000 mg | ORAL_TABLET | Freq: Two times a day (BID) | ORAL | Status: DC

## 2017-10-27 MED ORDER — AZITHROMYCIN 500 MG PO TABS
500.0000 mg | ORAL_TABLET | ORAL | Status: DC
  Administered 2017-10-27 – 2017-10-28 (×2): 500 mg via ORAL
  Filled 2017-10-27 (×3): qty 1

## 2017-10-27 NOTE — Progress Notes (Signed)
PROGRESS NOTE    Travis Carr  ZOX:[id number] DOB: 09/10/1940 DOA: 10/24/2017 PCP: Patient, No Pcp Per   Brief Narrative:   Travis Carr is an 77 y.o. male with medical history significant forBPH, remote cholecystectomy, and abdominal hernia repair several months ago, now presenting to the emergency department for evaluation of left leg swelling.  Found to have DVT- most likely provoked.    Assessment & Plan:   Principal Problem:   Leg DVT (deep venous thromboembolism), acute, left (HCC) Active Problems:   Liver mass, right lobe   Acute DVT (deep venous thrombosis) (HCC)   Acute DVT - with recent travel, suspected provoked (but on my discussion today, swelling started about 2 weeks ago and the travel was about 1.5 months ago).   - IV heparin -> will transition to eliquis today, plan to send with 30 day card.  He's going to return to [place] in early September.  Asked wife to try to make an appointment today.   - CTA negative for PE  CAP -suggestive on chest x-ray -fever -IV abx-- pt seems to be improving -blood cultures NGTD -incentive spirometry  Corynebacterium UTI: appears to be asx, but will discuss this with him more in depth tomorrow  Fullness in neck  presented suddenly per wife - CT scan negative for mass or adenopathy (likely 2/2 bulbous portion of parotid gland) - multiple dental caries -> needs outpatient follow up  N/V - continues to have nausea with taking PO - Continue soft diet - continue zofran - follow, unclear etiology, negative CT  Hypotension -IVF  Hypokalemia -replete  Lower ext edema -left from DVT but also has some insufficiency as well  Liver mass - f/u as outpatient with MRI   DVT prophylaxis: heparin -> eliquis today Code Status: full  Family Communication: wife at bedside Disposition Plan: pending safe discharge plan   Consultants:   none  Procedures:  LE US Final Interpretation: Right: There is no  evidence of deep vein thrombosis in the lower extremity. No cystic structure found in the popliteal fossa. Left: There is evidence of acute DVT in the Popliteal vein, Posterior Tibial veins, Peroneal veins, Femoral vein, and Gastrocnemius vein. No cystic structure found in the popliteal fossa.  Study Conclusions  - Left ventricle: The cavity size was normal. Systolic function was   normal. The estimated ejection fraction was in the range of 60%   to 65%. Wall motion was normal; there were no regional wall   motion abnormalities. There was an increased relative   contribution of atrial contraction to ventricular filling.   Doppler parameters are consistent with abnormal left ventricular   relaxation (grade 1 diastolic dysfunction). - Pulmonary arteries: Systolic pressure could not be accurately   estimated.  Antimicrobials:  Anti-infectives (From admission, onward)   Start     Dose/Rate Route Frequency Ordered Stop   10/27/17 1400  azithromycin (ZITHROMAX) tablet 500 mg     500 mg Oral Every 24 hours 10/27/17 0837 11/01/17 1359   10/25/17 1300  cefTRIAXone (ROCEPHIN) 1 g in sodium chloride 0.9 % 100 mL IVPB     1 g 200 mL/hr over 30 Minutes Intravenous Every 24 hours 10/25/17 1203     10/25/17 1300  azithromycin (ZITHROMAX) 500 mg in sodium chloride 0.9 % 250 mL IVPB  Status:  Discontinued     500 mg 250 mL/hr over 60 Minutes Intravenous Every 24 hours 10/25/17 1203 10/27/17 0837   10/24/17 2130  cefTRIAXone (ROCEPHIN) 1  g in sodium chloride 0.9 % 100 mL IVPB  Status:  Discontinued     1 g 200 mL/hr over 30 Minutes Intravenous  Once 10/24/17 2122 10/24/17 2151   10/24/17 2130  azithromycin (ZITHROMAX) 500 mg in sodium chloride 0.9 % 250 mL IVPB  Status:  Discontinued     500 mg 250 mL/hr over 60 Minutes Intravenous  Once 10/24/17 2122 10/24/17 2151     Subjective: States he came from [place] about 1.5 months ago Noticed swelling about 2 weeks ago No PCP.   Some nausea, but ok  now. No other complaints.  Objective: Vitals:   10/27/17 0014 10/27/17 0355 10/27/17 0510 10/27/17 0831  BP: 93/61  102/67 105/64  Pulse: (!) 110  (!) 106   Resp: (!) 28 (!) 24 (!) 32 (!) 30  Temp: 99.4 F (37.4 C)  99.6 F (37.6 C) 99 F (37.2 C)  TempSrc: Oral  Oral Oral  SpO2: 91% 94% 93% 93%  Weight:      Height:        Intake/Output Summary (Last 24 hours) at 10/27/2017 0952 Last data filed at 10/27/2017 0500 Gross per 24 hour  Intake 3128.82 ml  Output 400 ml  Net 2728.82 ml   Filed Weights   10/24/17 2100  Weight: 81.2 kg    Examination:  General: No acute distress. Cardiovascular: Mild tachycardia.  Heart sounds show a regular rate, and rhythm.  Lungs: No increased WOB, bibasilar crackles Abdomen: Soft, nontender, nondistended with normal active bowel sounds. No masses. No hepatosplenomegaly. Neurological: Alert and oriented 3. Moves all extremities 4. Cranial nerves II through XII grossly intact. Skin: Warm and dry. No rashes or lesions. Extremities: No clubbing or cyanosis. RLE edema Psychiatric: Mood and affect are normal. Insight and judgment are appropriate.   Data Reviewed: I have personally reviewed following labs and imaging studies  CBC: Recent Labs  Lab 10/24/17 1425 10/25/17 0320 10/26/17 0327 10/27/17 0355  WBC 7.3 6.4 5.0 5.8  HGB 14.2 13.3 12.1* 11.8*  HCT 44.7 41.0 37.6* 37.6*  MCV 99.8 96.7 98.2 100.0  PLT 187 175 175 164   Basic Metabolic Panel: Recent Labs  Lab 10/24/17 1425 10/24/17 1727 10/25/17 0320 10/26/17 0327 10/27/17 0355  NA 138  --  135 138 137  K 3.7  --  3.6 3.4* 3.8  CL 105  --  104 110 110  CO2 22  --  22 22 20*  GLUCOSE 127*  --  109* 128* 106*  BUN 5*  --  <5* 6* <5*  CREATININE 1.01  --  0.83 0.97 0.88  CALCIUM 8.7*  --  8.1* 7.5* 7.6*  MG  --  1.9  --   --   --    GFR: Estimated Creatinine Clearance: 73.4 mL/min (by C-G formula based on SCr of 0.88 mg/dL). Liver Function Tests: Recent Labs  Lab  10/24/17 1425 10/26/17 0327  AST 46* 37  ALT 41 38  ALKPHOS 73 63  BILITOT 1.2 1.2  PROT 5.9* 4.5*  ALBUMIN 3.0* 2.3*   Recent Labs  Lab 10/24/17 1425  LIPASE 26   Recent Labs  Lab 10/24/17 1735  AMMONIA 18   Coagulation Profile: Recent Labs  Lab 10/24/17 1727  INR 1.02   Cardiac Enzymes: No results for input(s): CKTOTAL, CKMB, CKMBINDEX, TROPONINI in the last 168 hours. BNP (last 3 results) No results for input(s): PROBNP in the last 8760 hours. HbA1C: No results for input(s): HGBA1C in the last 72 hours.  CBG: No results for input(s): GLUCAP in the last 168 hours. Lipid Profile: No results for input(s): CHOL, HDL, LDLCALC, TRIG, CHOLHDL, LDLDIRECT in the last 72 hours. Thyroid Function Tests: Recent Labs    10/24/17 1727  TSH 2.204   Anemia Panel: No results for input(s): VITAMINB12, FOLATE, FERRITIN, TIBC, IRON, RETICCTPCT in the last 72 hours. Sepsis Labs: Recent Labs  Lab 10/25/17 1250 10/25/17 1522  PROCALCITON <0.10  --   LATICACIDVEN 1.4 1.2    Recent Results (from the past 240 hour(s))  Urine culture     Status: Abnormal   Collection Time: 10/24/17  7:12 PM  Result Value Ref Range Status   Specimen Description URINE, CLEAN CATCH  Final   Special Requests NONE  Final   Culture (A)  Final    20,000 COLONIES/mL CORYNEBACTERIUM SPECIES Standardized susceptibility testing for this organism is not available. Performed at [facility], [address], [place], [place] [address]    Report Status 10/26/2017 FINAL  Final  Culture, blood (Routine X 2) w Reflex to ID Panel     Status: None (Preliminary result)   Collection Time: 10/25/17  1:03 PM  Result Value Ref Range Status   Specimen Description BLOOD RIGHT HAND  Final   Special Requests   Final    BOTTLES DRAWN AEROBIC ONLY Blood Culture adequate volume   Culture   Final    NO GROWTH < 24 HOURS Performed at [facility], [address], [place], [place] [address]    Report Status  PENDING  Incomplete         Radiology Studies: Ct Soft Tissue Neck W Contrast  Result Date: 10/26/2017 CLINICAL DATA:  Right neck mass.  Mass marked with a BB EXAM: CT NECK WITH CONTRAST TECHNIQUE: Multidetector CT imaging of the neck was performed using the standard protocol following the bolus administration of intravenous contrast. CONTRAST:  75mL OMNIPAQUE IOHEXOL 300 MG/ML  SOLN COMPARISON:  None. FINDINGS: Pharynx and larynx: Normal. No mass or swelling. Salivary glands: BB is present overlying the right inferior parotid gland. This portion gland is mildly prominent and bulbous but symmetric with the left. No mass or edema in the area. Parotid gland normal bilaterally. Submandibular gland normal bilaterally. Thyroid: Negative Lymph nodes: No enlarged lymph nodes in the neck. Vascular: Negative Limited intracranial: Negative Visualized orbits: Negative Mastoids and visualized paranasal sinuses: Mucosal edema in the paranasal sinuses. Left mastoid effusion with air-fluid levels. Skeleton: Cervical spondylosis. Poor dentition with multiple caries and periapical lucency most notably the left upper incisor but also right lower incisor and multiple additional teeth. Upper chest: Small pleural effusions and mild dependent atelectasis bilaterally Other: None IMPRESSION: Negative for mass or adenopathy. Palpable abnormality corresponds to a bulbous portion of the parotid gland which has normal density and is similar to that seen on the left. Poor dentition with multiple areas of dental infection with caries and periapical lucencies. Electronically Signed   By: Marlan Palau M.D.   On: 10/26/2017 13:41        Scheduled Meds: . azithromycin  500 mg Oral Q24H  . finasteride  5 mg Oral QHS  . multivitamin with minerals  1 tablet Oral Daily  . pantoprazole  40 mg Oral Daily  . sodium chloride flush  3 mL Intravenous Q12H  . sodium chloride flush  3 mL Intravenous Q12H  . tamsulosin  0.4 mg Oral QHS     Continuous Infusions: . sodium chloride    .  cefTRIAXone (ROCEPHIN)  IV Stopped (10/26/17 1227)  . heparin 1,550 Units/hr (10/26/17 1937)     LOS: 2 days    Time spent: over 30 min MDM High with acute extensive DVT on anticoagulation   Lacretia Nicksaldwell Powell, MD [facility] Pager [phone]  If 7PM-7AM, please contact night-coverage [email or web address] Password [employer] [facility] 10/27/2017, 9:52 AM

## 2017-10-27 NOTE — Progress Notes (Signed)
Patient requesting medication for Nausea, zofran given as ordered as needed for nausea. Praise Stennett, Randall AnKristin Jessup RN

## 2017-10-27 NOTE — Progress Notes (Signed)
Used interpretor to do assessment and pass morning medications. Pt complaining of nausea, IV zofran given. Pt with oral temp of 99.0. IS given, and pt educated to use ten times an hour to help bring down fever. All questions answered. Will continue to monitor.  Versie StarksHanna  Tayana Shankle, RN

## 2017-10-27 NOTE — Progress Notes (Addendum)
ANTICOAGULATION CONSULT NOTE   Pharmacy Consult for Heparin Indication: DVT  No Known Allergies  Patient Measurements: Ideal Body weight: 68 kg Heparin Dosing Weight: 81 kg   Vital Signs: Temp: 99.6 F (37.6 C) (08/16 0510) Temp Source: Oral (08/16 0510) BP: 102/67 (08/16 0510) Pulse Rate: 106 (08/16 0510)  Labs: Recent Labs    10/24/17 1727 10/25/17 0320  10/25/17 1522 10/26/17 0323 10/26/17 0327 10/27/17 0355  HGB  --  13.3  --   --   --  12.1* 11.8*  HCT  --  41.0  --   --   --  37.6* 37.6*  PLT  --  175  --   --   --  175 164  LABPROT 13.3  --   --   --   --   --   --   INR 1.02  --   --   --   --   --   --   HEPARINUNFRC  --   --    < > 0.42 0.53  --  0.33  CREATININE  --  0.83  --   --   --  0.97 0.88   < > = values in this interval not displayed.    Estimated Creatinine Clearance: 73.4 mL/min (by C-G formula based on SCr of 0.88 mg/dL).   Medical History: Past Medical History:  Diagnosis Date  . BPH (benign prostatic hyperplasia)     Medications:  Medications Prior to Admission  Medication Sig Dispense Refill Last Dose  . acetaminophen (TYLENOL) 650 MG CR tablet Take 650 mg by mouth every 8 (eight) hours as needed for pain.   10/24/2017 at Unknown time  . bumetanide (BUMEX) 1 MG tablet Take 1 mg by mouth daily.   Past Week at Unknown time  . finasteride (PROSCAR) 5 MG tablet Take 5 mg by mouth at bedtime.   10/23/2017 at Unknown time  . Loratadine-Pseudoephedrine (CLARITIN-D 12 HOUR PO) Take 1 tablet by mouth 2 (two) times daily.   Past Week at Unknown time  . Multiple Vitamin (MULTIVITAMIN WITH MINERALS) TABS tablet Take 1 tablet by mouth daily.   Past Week at Unknown time  . NON FORMULARY Take 4 mg by mouth at bedtime. Supra lidamidia   Past Week at Unknown time  . NON FORMULARY Take 1 tablet by mouth 3 (three) times daily. Espaven Enzimatico   Past Week at Unknown time  . NON FORMULARY Apply 1 application topically daily as needed (allergic reaction).  Barmacil Compuesto (betametasone, clotrimazole, gentamicin 50mg )   Past Month at Unknown time  . pantoprazole (PROTONIX) 40 MG tablet Take 40 mg by mouth daily.   Past Week at Unknown time  . predniSONE (DELTASONE) 50 MG tablet Take 50 mg by mouth daily with breakfast.   Past Week at Unknown time  . tamsulosin (FLOMAX) 0.4 MG CAPS capsule Take 0.4 mg by mouth at bedtime.   10/23/2017 at Unknown time    Assessment: [age] YOM who presents with peripheral edema, fatigue, shortness of breath and abdominal pain. He was found to have acute LE DVT. CT angio did not show a PE. Pharmacy consulted to start IV heparin. Patient is not on any anticoagulation prior to admission.  Heparin level today was therapeutic at 0.33, on 1550 units/hr. Hgb continues to drift down to 11.8, plt 164. Scr decreased from 0.97 to 0.88 today. No s/sx of bleeding. No infusion issues.  Goal of Therapy:  Heparin level 0.3-0.7 units/ml Monitor platelets by  anticoagulation protocol: Yes   Plan:  Increase heparin infusion to 1600 units/hr to keep in goal range Monitor daily heparin level and CBC, s/sx bleeding  F/u transition to PO anticoagulation as appropriate, case management consulted as no insurance  Girard CooterKimberly Perkins, PharmD Clinical Pharmacist  Pager: [phone] Phone: [phone] Please check AMION for all [facility] contact numbers 10/27/2017 8:17 AM  ADDENDUM Discussed with case management and MD. Plan to transition to apixaban treatment dosing. Will discontinue heparin infusion and start apixaban 10 mg twice daily for 7 days. Orders entered for 5 mg twice daily dosing after week. Patient will get 30 day free card with plans to follow up with home MD. Monitor CBC and for s/sx of bleeding.   Will provide education prior to discharge.  Girard CooterKimberly Perkins, PharmD Clinical Pharmacist

## 2017-10-27 NOTE — Care Management Note (Addendum)
Case Management Note Donn PieriniKristi Treshun Wold RN, BSN Unit [address]E- RN Care Coordinator  [phone]  Patient Details  Name: Travis Carr MRN: [id number] Date of Birth: 01/24/1941  Subjective/Objective:   Pt admitted with PNA, and +DVT                 Action/Plan: PTA pt visiting here with wife from [place] staying with daughter- spoke with pt and wife at bedside via interpreter [name] (Dr. Lowell GuitarPowell also joined). Per conversation wife states they are returning to [place] on Sept. 3, pt has PCP back in [place] that he can f/u with- per wife she has the phone # and can call doctor in [place] for appointment (will try to call today) Pt does not have insurance in [place], pays for medications out of pocket. Wife reports pt's doctor is able to do bloodwork. Per MD plan will be to start pt on Eliquis- will provide pt with 30 day free card to fill here for 30 day supply, pt will need to see PCP in [place] as soon as he returns for f/u and to get plan for what med to be on through is PCP- wife voices understanding of this and the importance of f/u on return to [place].   Expected Discharge Date:                 Expected Discharge Plan:  Home/Self Care  In-House Referral:  Interpreting Services  Discharge planning Services  CM Consult, Medication Assistance  Post Acute Care Choice:    Choice offered to:     DME Arranged:    DME Agency:     HH Arranged:    HH Agency:     Status of Service:  In process, will continue to follow  If discussed at Long Length of Stay Meetings, dates discussed:    Discharge Disposition: home/self care   Additional Comments:  Darrold SpanWebster, Aiven Kampe Hall, RN 10/27/2017, 11:26 AM

## 2017-10-28 LAB — BASIC METABOLIC PANEL
ANION GAP: 7 (ref 5–15)
BUN: <5 mg/dL — ABNORMAL LOW (ref 8–23)
CALCIUM: 8 mg/dL — AB (ref 8.9–10.3)
CO2: 24 mmol/L (ref 22–32)
Chloride: 105 mmol/L (ref 98–111)
Creatinine, Ser: 0.94 mg/dL (ref 0.61–1.24)
GFR calc Af Amer: >60 mL/min (ref >60)
GLUCOSE: 94 mg/dL (ref 70–99)
Potassium: 3.5 mmol/L (ref 3.5–5.1)
Sodium: 136 mmol/L (ref 135–145)

## 2017-10-28 LAB — CBC
HEMATOCRIT: 36.4 % — AB (ref 39.0–52.0)
Hemoglobin: 11.6 g/dL — ABNORMAL LOW (ref 13.0–17.0)
MCH: 31.7 pg (ref 26.0–34.0)
MCHC: 31.9 g/dL (ref 30.0–36.0)
MCV: 99.5 fL (ref 78.0–100.0)
PLATELETS: 178 10*3/uL (ref 150–400)
RBC: 3.66 MIL/uL — ABNORMAL LOW (ref 4.22–5.81)
RDW: 14.6 % (ref 11.5–15.5)
WBC: 5 10*3/uL (ref 4.0–10.5)

## 2017-10-28 LAB — MAGNESIUM: Magnesium: 1.7 mg/dL (ref 1.7–2.4)

## 2017-10-28 NOTE — Progress Notes (Signed)
Per Dr.Powell's request pt ambulated with RN on RA. Pt ambulated 260 ft with front wheel walker on RA. Oxygen sat at rest was 93% on RA. Oxygen sat while walking dropped to 88-89% on RA.   Pt back to bed and placed on 2L N/C. Call bell within reach. Will continue to monitor.  William DaltonMegan Demarea Lorey,RN, BSN

## 2017-10-28 NOTE — Progress Notes (Signed)
Pt expressed need for nausea medication with NT via interpreter. RN will admin PRN Zofran and continue to monitor.  William DaltonMegan Armand Preast,RN, BSN

## 2017-10-28 NOTE — Progress Notes (Signed)
Interpreter used to communicate with patient. Pt verbalized no needs at this time. Plan of care reviewed with pt and his wife. Pt informed to use call bell for assistance. Pt resting in bed call bell within reach.  Will continue to monitor.  Travis DaltonMegan Jamario Colina,RN, BSN

## 2017-10-28 NOTE — Progress Notes (Signed)
PROGRESS NOTE    Travis Carr  ZOX:[id number] DOB: [birth year] DOA: 10/24/2017 PCP: Patient, No Pcp Per   Brief Narrative:   Travis Carr is an 77 y.o. male with medical history significant forBPH, remote cholecystectomy, and abdominal hernia repair several months ago, now presenting to the emergency department for evaluation of left leg swelling.  Found to have DVT- most likely provoked.    Assessment & Plan:   Principal Problem:   Leg DVT (deep venous thromboembolism), acute, left (HCC) Active Problems:   Liver mass, right lobe   Acute DVT (deep venous thrombosis) (HCC)   Acute DVT - with recent travel, suspected provoked (but on my discussion today, swelling started about 2 weeks ago and the travel was about 1.5 months ago).   - IV heparin -> will transition to eliquis today, plan to send with 30 day card.  He's going to return to [place] in early September.  Unable to make appointment, but they note should be able to see doctor easily when they get to [place].  Emphasized importance of this.  - CTA negative for PE  CAP -suggestive on chest x-ray -fever -IV abx, will continue IV abx -blood cultures NGTD -incentive spirometry  Corynebacterium UTI: appears to be asx.  Likely contaminant.   Fullness in neck  presented suddenly per wife - CT scan negative for mass or adenopathy (likely 2/2 bulbous portion of parotid gland) - multiple dental caries -> needs outpatient follow up  N/V - continues to have nausea with taking PO, unclear etiology - negative CT - Continue soft diet  Hypotension - stable, improved  Hypokalemia - improved  Lower ext edema -left from DVT but also has some insufficiency as well  Liver mass - f/u as outpatient with MRI, discussed importance  DVT prophylaxis: heparin -> eliquis today Code Status: full  Family Communication: wife at bedside Disposition Plan: pending safe discharge plan - likely tomorrow   Consultants:    none  Procedures:  LE [place] Final Interpretation: Right: There is no evidence of deep vein thrombosis in the lower extremity. No cystic structure found in the popliteal fossa. Left: There is evidence of acute DVT in the Popliteal vein, Posterior Tibial veins, Peroneal veins, Femoral vein, and Gastrocnemius vein. No cystic structure found in the popliteal fossa.  Study Conclusions  - Left ventricle: The cavity size was normal. Systolic function was   normal. The estimated ejection fraction was in the range of 60%   to 65%. Wall motion was normal; there were no regional wall   motion abnormalities. There was an increased relative   contribution of atrial contraction to ventricular filling.   Doppler parameters are consistent with abnormal left ventricular   relaxation (grade 1 diastolic dysfunction). - Pulmonary arteries: Systolic pressure could not be accurately   estimated.  Antimicrobials:  Anti-infectives (From admission, onward)   Start     Dose/Rate Route Frequency Ordered Stop   10/27/17 1400  azithromycin (ZITHROMAX) tablet 500 mg     500 mg Oral Every 24 hours 10/27/17 0837 11/01/17 1359   10/25/17 1300  cefTRIAXone (ROCEPHIN) 1 g in sodium chloride 0.9 % 100 mL IVPB     1 g 200 mL/hr over 30 Minutes Intravenous Every 24 hours 10/25/17 1203     10/25/17 1300  azithromycin (ZITHROMAX) 500 mg in sodium chloride 0.9 % 250 mL IVPB  Status:  Discontinued     500 mg 250 mL/hr over 60 Minutes Intravenous Every 24 hours 10/25/17 1203  10/27/17 0837   10/24/17 2130  cefTRIAXone (ROCEPHIN) 1 g in sodium chloride 0.9 % 100 mL IVPB  Status:  Discontinued     1 g 200 mL/hr over 30 Minutes Intravenous  Once 10/24/17 2122 10/24/17 2151   10/24/17 2130  azithromycin (ZITHROMAX) 500 mg in sodium chloride 0.9 % 250 mL IVPB  Status:  Discontinued     500 mg 250 mL/hr over 60 Minutes Intravenous  Once 10/24/17 2122 10/24/17 2151     Subjective: No CP or SOB. Some nausea after he  eats. Discussed importance of follow up back in [place]. Interpreter used.  Objective: Vitals:   10/27/17 1647 10/27/17 1935 10/28/17 0658 10/28/17 0745  BP: 103/65 105/65    Pulse: 95 100    Resp: (!) 25 (!) 30  (!) 32  Temp: 99.2 F (37.3 C) 99.6 F (37.6 C)    TempSrc: Oral Oral    SpO2: 95%   90%  Weight:   83.7 kg   Height:       No intake or output data in the 24 hours ending 10/28/17 1458 Filed Weights   10/24/17 2100 10/28/17 0658  Weight: 81.2 kg 83.7 kg    Examination:  General: No acute distress. Cardiovascular: Heart sounds show a tachycardic rate, and rhythm.  Lungs: Bibasilar crackles Abdomen: Soft, nontender, nondistended Neurological: Alert and oriented 3. Moves all extremities 4. Cranial nerves II through XII grossly intact. Skin: Warm and dry. No rashes or lesions. Extremities: No clubbing or cyanosis. LLE edema Psychiatric: Mood and affect are normal. Insight and judgment are appropriate.   Data Reviewed: I have personally reviewed following labs and imaging studies  CBC: Recent Labs  Lab 10/24/17 1425 10/25/17 0320 10/26/17 0327 10/27/17 0355 10/28/17 0416  WBC 7.3 6.4 5.0 5.8 5.0  HGB 14.2 13.3 12.1* 11.8* 11.6*  HCT 44.7 41.0 37.6* 37.6* 36.4*  MCV 99.8 96.7 98.2 100.0 99.5  PLT 187 175 175 164 178   Basic Metabolic Panel: Recent Labs  Lab 10/24/17 1425 10/24/17 1727 10/25/17 0320 10/26/17 0327 10/27/17 0355 10/28/17 0416  NA 138  --  135 138 137 136  K 3.7  --  3.6 3.4* 3.8 3.5  CL 105  --  104 110 110 105  CO2 22  --  22 22 20* 24  GLUCOSE 127*  --  109* 128* 106* 94  BUN 5*  --  <5* 6* <5* <5*  CREATININE 1.01  --  0.83 0.97 0.88 0.94  CALCIUM 8.7*  --  8.1* 7.5* 7.6* 8.0*  MG  --  1.9  --   --   --  1.7   GFR: Estimated Creatinine Clearance: 68.8 mL/min (by C-G formula based on SCr of 0.94 mg/dL). Liver Function Tests: Recent Labs  Lab 10/24/17 1425 10/26/17 0327  AST 46* 37  ALT 41 38  ALKPHOS 73 63  BILITOT  1.2 1.2  PROT 5.9* 4.5*  ALBUMIN 3.0* 2.3*   Recent Labs  Lab 10/24/17 1425  LIPASE 26   Recent Labs  Lab 10/24/17 1735  AMMONIA 18   Coagulation Profile: Recent Labs  Lab 10/24/17 1727  INR 1.02   Cardiac Enzymes: No results for input(s): CKTOTAL, CKMB, CKMBINDEX, TROPONINI in the last 168 hours. BNP (last 3 results) No results for input(s): PROBNP in the last 8760 hours. HbA1C: No results for input(s): HGBA1C in the last 72 hours. CBG: No results for input(s): GLUCAP in the last 168 hours. Lipid Profile: No results for input(s):  CHOL, HDL, LDLCALC, TRIG, CHOLHDL, LDLDIRECT in the last 72 hours. Thyroid Function Tests: No results for input(s): TSH, T4TOTAL, FREET4, T3FREE, THYROIDAB in the last 72 hours. Anemia Panel: No results for input(s): VITAMINB12, FOLATE, FERRITIN, TIBC, IRON, RETICCTPCT in the last 72 hours. Sepsis Labs: Recent Labs  Lab 10/25/17 1250 10/25/17 1522  PROCALCITON <0.10  --   LATICACIDVEN 1.4 1.2    Recent Results (from the past 240 hour(s))  Urine culture     Status: Abnormal   Collection Time: 10/24/17  7:12 PM  Result Value Ref Range Status   Specimen Description URINE, CLEAN CATCH  Final   Special Requests NONE  Final   Culture (A)  Final    20,000 COLONIES/mL CORYNEBACTERIUM SPECIES Standardized susceptibility testing for this organism is not available. Performed at [facility], [address] [place], [place] [address]    Report Status 10/26/2017 FINAL  Final  Culture, blood (Routine X 2) w Reflex to ID Panel     Status: None (Preliminary result)   Collection Time: 10/25/17  1:03 PM  Result Value Ref Range Status   Specimen Description BLOOD RIGHT HAND  Final   Special Requests   Final    BOTTLES DRAWN AEROBIC ONLY Blood Culture adequate volume   Culture   Final    NO GROWTH 3 DAYS Performed at [facility], [address] [place], [place] [address]    Report Status PENDING  Incomplete          Radiology Studies: No results found.      Scheduled Meds: . apixaban  10 mg Oral BID   Followed by  . [START ON 11/03/2017] apixaban  5 mg Oral BID  . azithromycin  500 mg Oral Q24H  . finasteride  5 mg Oral QHS  . multivitamin with minerals  1 tablet Oral Daily  . pantoprazole  40 mg Oral Daily  . sodium chloride flush  3 mL Intravenous Q12H  . sodium chloride flush  3 mL Intravenous Q12H  . tamsulosin  0.4 mg Oral QHS   Continuous Infusions: . sodium chloride Stopped (10/27/17 1258)  . cefTRIAXone (ROCEPHIN)  IV 1 g (10/28/17 1257)     LOS: 3 days    Time spent: over 30 min MDM mod with multiple medical issues   Lacretia Nicksaldwell Powell, MD [facility] Pager [phone]  If 7PM-7AM, please contact night-coverage [email or web address] Password [email or web address] 10/28/2017, 2:58 PM

## 2017-10-29 DIAGNOSIS — I82402 Acute embolism and thrombosis of unspecified deep veins of left lower extremity: Secondary | ICD-10-CM

## 2017-10-29 LAB — CBC
HCT: 36.1 % — ABNORMAL LOW (ref 39.0–52.0)
Hemoglobin: 11.8 g/dL — ABNORMAL LOW (ref 13.0–17.0)
MCH: 31.8 pg (ref 26.0–34.0)
MCHC: 32.7 g/dL (ref 30.0–36.0)
MCV: 97.3 fL (ref 78.0–100.0)
PLATELETS: 192 10*3/uL (ref 150–400)
RBC: 3.71 MIL/uL — AB (ref 4.22–5.81)
RDW: 14.6 % (ref 11.5–15.5)
WBC: 5.2 10*3/uL (ref 4.0–10.5)

## 2017-10-29 LAB — BASIC METABOLIC PANEL
ANION GAP: 8 (ref 5–15)
BUN: <5 mg/dL — ABNORMAL LOW (ref 8–23)
CALCIUM: 7.8 mg/dL — AB (ref 8.9–10.3)
CO2: 24 mmol/L (ref 22–32)
Chloride: 104 mmol/L (ref 98–111)
Creatinine, Ser: 0.89 mg/dL (ref 0.61–1.24)
Glucose, Bld: 105 mg/dL — ABNORMAL HIGH (ref 70–99)
POTASSIUM: 3.6 mmol/L (ref 3.5–5.1)
Sodium: 136 mmol/L (ref 135–145)

## 2017-10-29 LAB — MAGNESIUM: MAGNESIUM: 1.9 mg/dL (ref 1.7–2.4)

## 2017-10-29 MED ORDER — AZITHROMYCIN 500 MG PO TABS
500.0000 mg | ORAL_TABLET | ORAL | Status: AC
  Administered 2017-10-29: 500 mg via ORAL
  Filled 2017-10-29: qty 1

## 2017-10-29 MED ORDER — APIXABAN 5 MG PO TABS: 10.0000 mg | ORAL_TABLET | Freq: Two times a day (BID) | ORAL | 0 refills | Status: AC

## 2017-10-29 MED ORDER — APIXABAN 5 MG PO TABS: 5.0000 mg | ORAL_TABLET | Freq: Two times a day (BID) | ORAL | 0 refills | Status: AC

## 2017-10-29 MED ORDER — SODIUM CHLORIDE 0.9 % IV SOLN
1.0000 g | INTRAVENOUS | Status: AC
  Administered 2017-10-29: 1 g via INTRAVENOUS
  Filled 2017-10-29: qty 10

## 2017-10-29 NOTE — Progress Notes (Signed)
Pt given AVS handout in Spanish; comments noted in spanish. Interpreter used to translate discharge instructions. Pt and family verbalized understanding. All questions answered. Pt confirmed that 30day free card for Eliquis was given to him by case management. IV's removed. Pt stable at discharge.   Pt transported via wheelchair by NT.  Roselie AwkwardMegan Tzion Wedel, RN, BSN

## 2017-10-29 NOTE — Evaluation (Signed)
Physical Therapy Evaluation & Discharge Patient Details Name: Travis Carr MRN: [id number] DOB: 09/11/1940 Today's Date: 10/29/2017   History of Present Illness  Pt is a 77 y.o. male admitted 10/24/17 with LLE swelling; found to have DVT. CXR suggestive for CAP.   Clinical Impression  Patient evaluated by Physical Therapy with no further acute PT needs identified. PTA, pt indep and lives with wife in [place]. Pt currently ambulating independently. SpO2 91% on RA. All education has been completed and the patient has no further questions. Encouraged pt to continue ambulating during hospital admission (RN aware). PT is signing off. Thank you for this referral.  Spanish interpreter utilized via ipad interpreting services    Follow Up Recommendations No PT follow up    Equipment Recommendations  None recommended by PT    Recommendations for Other Services       Precautions / Restrictions Precautions Precautions: None Restrictions Weight Bearing Restrictions: No      Mobility  Bed Mobility Overal bed mobility: Independent                Transfers Overall transfer level: Independent                  Ambulation/Gait Ambulation/Gait assistance: Independent   Assistive device: None Gait Pattern/deviations: Step-through pattern;Decreased stride length   Gait velocity interpretation: 1.31 - 2.62 ft/sec, indicative of limited Company secretarycommunity ambulator    Stairs            Wheelchair Mobility    Modified Rankin (Stroke Patients Only)       Balance Overall balance assessment: No apparent balance deficits (not formally assessed)                                           Pertinent Vitals/Pain Pain Assessment: No/denies pain    Home Living Family/patient expects to be discharged to:: Private residence Living Arrangements: Spouse/significant other Available Help at Discharge: Family;Available 24 hours/day             Additional  Comments: Currently in [place] with [place] visiting [place]; return home to [place] in September    Prior Function Level of Independence: Independent               Hand Dominance        Extremity/Trunk Assessment   Upper Extremity Assessment Upper Extremity Assessment: Overall WFL for tasks assessed    Lower Extremity Assessment Lower Extremity Assessment: Overall WFL for tasks assessed    Cervical / Trunk Assessment Cervical / Trunk Assessment: Kyphotic  Communication   Communication: Interpreter utilized;Prefers language other than English(Spanish-speaking)  Cognition Arousal/Alertness: Awake/alert Behavior During Therapy: WFL for tasks assessed/performed Overall Cognitive Status: Within Functional Limits for tasks assessed                                 General Comments: Via Spanish interpreter (ipad)      General Comments      Exercises     Assessment/Plan    PT Assessment Patent does not need any further PT services  PT Problem List         PT Treatment Interventions      PT Goals (Current goals can be found in the Care Plan section)  Acute Rehab PT Goals PT Goal Formulation: All assessment and education complete,  DC therapy    Frequency     Barriers to discharge        Co-evaluation               AM-PAC PT "6 Clicks" Daily Activity  Outcome Measure Difficulty turning over in bed (including adjusting bedclothes, sheets and blankets)?: None Difficulty moving from lying on back to sitting on the side of the bed? : None Difficulty sitting down on and standing up from a chair with arms (e.g., wheelchair, bedside commode, etc,.)?: None Help needed moving to and from a bed to chair (including a wheelchair)?: None Help needed walking in hospital room?: None Help needed climbing 3-5 steps with a railing? : A Little 6 Click Score: 23    End of Session Equipment Utilized During Treatment: Gait belt Activity Tolerance: Patient  tolerated treatment well Patient left: in bed;with call bell/phone within reach;with family/visitor present Nurse Communication: Mobility status PT Visit Diagnosis: Other abnormalities of gait and mobility (R26.89)    Time: [id number] PT Time Calculation (min) (ACUTE ONLY): 15 min   Charges:   PT Evaluation $PT Eval Low Complexity: 1 Low         Ina HomesJaclyn Kynzee Devinney, PT, DPT Acute Rehab Services  Pager: [phone]  Malachy ChamberJaclyn L Nesa Distel 10/29/2017, 9:15 AM

## 2017-10-29 NOTE — Discharge Summary (Signed)
Physician Discharge Summary  Donnavin Vandenbrink ZOX:[id number] DOB: 07/22/1940 DOA: 10/24/2017  PCP: Patient, No Pcp Per  Admit date: 10/24/2017 Discharge date: 10/29/2017  Time spent: 35 minutes  Recommendations for Outpatient Follow-up:  1. Follow up outpatient CBC/CMP 2. Ensure follow up with PCP in [place] for refill of anticoagulation 3. Needs f/u for liver imaging as well    Discharge Diagnoses:  Principal Problem:   Leg DVT (deep venous thromboembolism), acute, left (HCC) Active Problems:   Liver mass, right lobe   Acute DVT (deep venous thrombosis) (HCC)   Discharge Condition: stable  Diet recommendation: heart healthy  Filed Weights   10/24/17 2100 10/28/17 0658  Weight: 81.2 kg 83.7 kg    History of present illness:  Domingo Pulse Garciais an 77 y.o.malewith medical history significant forBPH, remote cholecystectomy, and abdominal hernia repair several months ago, now presenting to the emergency department for evaluation of left leg swelling. Found to have DVT.  He was admitted for Arilynn Blakeney DVT and also treated for pneumonia.  Pt has had extensive travel in last month with travel here from [place].  Suspicion for possibly provoked DVT.  He developed pneumonia here and had 5 days of abx.  He was discharged with eliquis with plans to return to [place] in early September for follow up and continuation of anticoagulation.  He also had abnormal liver imaging which he was recommended to follow up in [place].  See below for additional details  Hospital Course:  Acute DVT - with recent travel, suspected provoked (but on my discussion today, swelling started about 2 weeks ago and the travel was about 1.5 months ago).   - IV heparin -> transitioned to eliquis.  Discharged with 30 day free card.  He'll return to [place] in early September, discussed importance of continue this medication uninterrupted and f/u with provider in [place] for refill or alternative med before he runs out (or if  he runs out to immediately seek care).  - CTA negative for PE  CAP - s/p 5 days abx  Corynebacterium UTI:  asx.  Likely contaminant.   Fullness in neck  presented suddenly per wife - CT scan negative for mass or adenopathy (likely 2/2 bulbous portion of parotid gland) - multiple dental caries -> needs outpatient follow up  N/V - improved, follow  Hypotension - stable, improved  Hypokalemia - improved  Lower ext edema -left from DVT but also has some insufficiency as well  Liver mass - f/u as outpatient with MRI, discussed importance  Procedures: Study Conclusions  - Left ventricle: The cavity size was normal. Systolic function was   normal. The estimated ejection fraction was in the range of 60%   to 65%. Wall motion was normal; there were no regional wall   motion abnormalities. There was an increased relative   contribution of atrial contraction to ventricular filling.   Doppler parameters are consistent with abnormal left ventricular   relaxation (grade 1 diastolic dysfunction). - Pulmonary arteries: Systolic pressure could not be accurately   estimated.  Final Interpretation: Right: There is no evidence of deep vein thrombosis in the lower extremity. No cystic structure found in the popliteal fossa. Left: There is evidence of acute DVT in the Popliteal vein, Posterior Tibial veins, Peroneal veins, Femoral vein, and Gastrocnemius vein. No cystic structure found in the popliteal fossa.  Consultations:  none  Discharge Exam: Vitals:   10/28/17 0745 10/29/17 1125  BP:  109/69  Pulse:  (!) 111  Resp: (!) 32  16  Temp:    SpO2: 90% 100%   Interpreter used. Pt and wife at bedside. No nausea, feeling better. No CP or SOB. Discussed plan for d/c and follow up. Emphasized importance of eliquis and importance of f/u for anticoagulation as well as liver imaging. Questions answered with interpreter.  General: No acute distress. Cardiovascular: Heart  sounds show Tereso Unangst regular rate, and rhythm.  Lungs: Clear to auscultation bilaterally with good air movement.  Abdomen: Soft, nontender, nondistended with normal active bowel sounds. No masses. No hepatosplenomegaly. Neurological: Alert and oriented 3. Moves all extremities 4. Cranial nerves II through XII grossly intact. Skin: Warm and dry. No rashes or lesions. Extremities: No clubbing or cyanosis. LLE edema Psychiatric: Mood and affect are normal. Insight and judgment are appropriate.  Discharge Instructions   Discharge Instructions    Call MD for:  difficulty breathing, headache or visual disturbances   Complete by:  As directed    Call MD for:  extreme fatigue   Complete by:  As directed    Call MD for:  persistant dizziness or light-headedness   Complete by:  As directed    Call MD for:  persistant nausea and vomiting   Complete by:  As directed    Call MD for:  severe uncontrolled pain   Complete by:  As directed    Call MD for:  temperature >100.4   Complete by:  As directed    Diet - low sodium heart healthy   Complete by:  As directed    Discharge instructions   Complete by:  As directed    You were seen for Ionia Schey DVT (Furman Trentman blood clot) in your left leg.  We treated this with Ledonna Dormer blood thinner.  It's extremely important that you take the eliquis as prescribed and follow up with your primary care provider for Rooney Gladwin refill before you run out.  If you run out before seeing Maude Hettich doctor or getting Vyom Brass refill you should be seen by Soraya Paquette doctor immediately.  You were treated for pneumonia and have improved with antibiotics.  You had abnormal findings on your liver.  Please follow up with an MRI of your liver as an outpatient with your primary care provider in [place].  Return for new, recurrent, or worsening symptoms.  Please ask your PCP to request records from this hospitalization so they know what was done and what the next steps will be.   Increase activity slowly   Complete by:  As directed       Allergies as of 10/29/2017   No Known Allergies     Medication List    STOP taking these medications   bumetanide 1 MG tablet Commonly known as:  BUMEX   predniSONE 50 MG tablet Commonly known as:  DELTASONE     TAKE these medications   acetaminophen 650 MG CR tablet Commonly known as:  TYLENOL Take 650 mg by mouth every 8 (eight) hours as needed for pain.   apixaban 5 MG Tabs tablet Commonly known as:  ELIQUIS Take 2 tablets (10 mg total) by mouth 2 (two) times daily for 9 doses. (first dose tonight).  After this, please take 5 mg BID   apixaban 5 MG Tabs tablet Commonly known as:  ELIQUIS Take 1 tablet (5 mg total) by mouth 2 (two) times daily for 25 days. (take this after completion of 10 mg BID dose)   CLARITIN-D 12 HOUR PO Take 1 tablet by mouth 2 (two) times daily.  finasteride 5 MG tablet Commonly known as:  PROSCAR Take 5 mg by mouth at bedtime.   multivitamin with minerals Tabs tablet Take 1 tablet by mouth daily.   NON FORMULARY Take 4 mg by mouth at bedtime. Supra lidamidia   NON FORMULARY Take 1 tablet by mouth 3 (three) times daily. Espaven Enzimatico   NON FORMULARY Apply 1 application topically daily as needed (allergic reaction). Barmacil Compuesto (betametasone, clotrimazole, gentamicin 50mg )   pantoprazole 40 MG tablet Commonly known as:  PROTONIX Take 40 mg by mouth daily.   tamsulosin 0.4 MG Caps capsule Commonly known as:  FLOMAX Take 0.4 mg by mouth at bedtime.      No Known Allergies    The results of significant diagnostics from this hospitalization (including imaging, microbiology, ancillary and laboratory) are listed below for reference.    Significant Diagnostic Studies: Dg Chest 2 View  Result Date: 10/24/2017 CLINICAL DATA:  Shortness of breath EXAM: CHEST - 2 VIEW COMPARISON:  None. FINDINGS: There is patchy opacity in the right middle lobe. There is slight atelectasis in the left base. Lungs elsewhere clear. Heart  size and pulmonary vascularity are normal. No adenopathy. No bone lesions. IMPRESSION: Patchy opacity in the right middle lobe consistent with Bransen [place]assnacht degree of pneumonia and atelectasis. Mild atelectasis left base. Lungs elsewhere clear. Heart size normal. Electronically Signed   By: Bretta Bang III M.D.   On: 10/24/2017 16:58   Ct Soft Tissue Neck W Contrast  Result Date: 10/26/2017 CLINICAL DATA:  Right neck mass.  Mass marked with Dollie Mayse BB EXAM: CT NECK WITH CONTRAST TECHNIQUE: Multidetector CT imaging of the neck was performed using the standard protocol following the bolus administration of intravenous contrast. CONTRAST:  75mL OMNIPAQUE IOHEXOL 300 MG/ML  SOLN COMPARISON:  None. FINDINGS: Pharynx and larynx: Normal. No mass or swelling. Salivary glands: BB is present overlying the right inferior parotid gland. This portion gland is mildly prominent and bulbous but symmetric with the left. No mass or edema in the area. Parotid gland normal bilaterally. Submandibular gland normal bilaterally. Thyroid: Negative Lymph nodes: No enlarged lymph nodes in the neck. Vascular: Negative Limited intracranial: Negative Visualized orbits: Negative Mastoids and visualized paranasal sinuses: Mucosal edema in the paranasal sinuses. Left mastoid effusion with air-fluid levels. Skeleton: Cervical spondylosis. Poor dentition with multiple caries and periapical lucency most notably the left upper incisor but also right lower incisor and multiple additional teeth. Upper chest: Small pleural effusions and mild dependent atelectasis bilaterally Other: None IMPRESSION: Negative for mass or adenopathy. Palpable abnormality corresponds to Alizza Sacra bulbous portion of the parotid gland which has normal density and is similar to that seen on the left. Poor dentition with multiple areas of dental infection with caries and periapical lucencies. Electronically Signed   By: Marlan Palau M.D.   On: 10/26/2017 13:41   Ct Angio Chest Pe W And/or Wo  Contrast  Result Date: 10/24/2017 CLINICAL DATA:  Exertional dyspnea. Reported left lower extremity DVT. EXAM: CT ANGIOGRAPHY CHEST WITH CONTRAST TECHNIQUE: Multidetector CT imaging of the chest was performed using the standard protocol during bolus administration of intravenous contrast. Multiplanar CT image reconstructions and MIPs were obtained to evaluate the vascular anatomy. CONTRAST:  80mL ISOVUE-370 IOPAMIDOL (ISOVUE-370) INJECTION 76% COMPARISON:  Chest radiograph from earlier today. FINDINGS: Cardiovascular: The study is high quality for the evaluation of pulmonary embolism. There are no filling defects in the central, lobar, segmental or subsegmental pulmonary artery branches to suggest acute pulmonary embolism. Mildly atherosclerotic nonaneurysmal thoracic  aorta. Normal caliber pulmonary arteries. Normal heart size. No significant pericardial fluid/thickening. Mediastinum/Nodes: No discrete thyroid nodules. Unremarkable esophagus. No pathologically enlarged axillary, mediastinal or hilar lymph nodes. Lungs/Pleura: No pneumothorax. No pleural effusion. No acute consolidative airspace disease, lung masses or significant pulmonary nodules. Parenchymal bands in the lower lobes could represent scarring or mild atelectasis. Upper abdomen: Hypodense 2.7 cm inferior right liver mass (series 5/image 110). Musculoskeletal: No aggressive appearing focal osseous lesions. Mild thoracic spondylosis. Review of the MIP images confirms the above findings. IMPRESSION: 1. No pulmonary embolism. 2. Mild scarring versus atelectasis in the lower lobes. 3. Indeterminate hypodense 2.7 cm inferior right liver mass. MRI abdomen without and with IV contrast recommended for further characterization, which may be performed on Rahim Astorga short term outpatient basis. Aortic Atherosclerosis (ICD10-I70.0). Electronically Signed   By: Delbert Phenix M.D.   On: 10/24/2017 20:18   Ct Abdomen Pelvis W Contrast  Result Date: 10/24/2017 CLINICAL  DATA:  History of recent cholecystectomy with abdominal pain, initial encounter EXAM: CT ABDOMEN AND PELVIS WITH CONTRAST TECHNIQUE: Multidetector CT imaging of the abdomen and pelvis was performed using the standard protocol following bolus administration of intravenous contrast. CONTRAST:  OMNIPAQUE IOHEXOL 300 MG/ML  SOLN COMPARISON:  None. FINDINGS: Lower chest: Within normal limits. Hepatobiliary: Gallbladder has been surgically removed. No postsurgical fluid collection is identified. Fatty infiltration of the liver is noted. Hypodense lesion is noted measuring 2.8 cm in the right lobe of the liver laterally. It shows changes of peripheral calcification and may represent Char Feltman complicated cyst. Nonemergent ultrasound may be helpful for further evaluation. Pancreas: Unremarkable. No pancreatic ductal dilatation or surrounding inflammatory changes. Spleen: Normal in size without focal abnormality. Adrenals/Urinary Tract: Adrenal glands are within normal limits. Kidneys are well visualized bilaterally. Excretion of contrast is noted. No obstructive changes are seen. Emory Leaver large left renal cyst is noted measuring 10.6 cm. Bladder is partially distended. Stomach/Bowel: Scattered diverticular change of the colon is noted without evidence of diverticulitis. No obstructive or inflammatory changes are seen. The appendix is not well appreciated and may have been surgically removed. No inflammatory changes are seen. No small bowel abnormality is noted. Stomach is within normal limits. Vascular/Lymphatic: Aortic atherosclerosis. No enlarged abdominal or pelvic lymph nodes. Reproductive: Prostate is unremarkable. Other: No abdominal wall hernia or abnormality. No abdominopelvic ascites. Musculoskeletal: Degenerative changes of lumbar spine are seen. IMPRESSION: Fatty infiltration of the liver. Hypodense lesion in the right lobe of the liver with peripheral calcifications suggestive of complicated cyst. Nonemergent ultrasound  would be helpful for further evaluation. Large left renal cyst. Diverticulosis without diverticulitis. Electronically Signed   By: Alcide Clever M.D.   On: 10/24/2017 19:57    Microbiology: Recent Results (from the past 240 hour(s))  Urine culture     Status: Abnormal   Collection Time: 10/24/17  7:12 PM  Result Value Ref Range Status   Specimen Description URINE, CLEAN CATCH  Final   Special Requests NONE  Final   Culture (Janace Decker)  Final    20,000 COLONIES/mL CORYNEBACTERIUM SPECIES Standardized susceptibility testing for this organism is not available. Performed at [facility], [address], [place], [place] [address]    Report Status 10/26/2017 FINAL  Final  Culture, blood (Routine X 2) w Reflex to ID Panel     Status: None (Preliminary result)   Collection Time: 10/25/17  1:03 PM  Result Value Ref Range Status   Specimen Description BLOOD RIGHT HAND  Final   Special Requests  Final    BOTTLES DRAWN AEROBIC ONLY Blood Culture adequate volume   Culture   Final    NO GROWTH 3 DAYS Performed at [facility], [address] [place], [place] [address]    Report Status PENDING  Incomplete     Labs: Basic Metabolic Panel: Recent Labs  Lab 10/24/17 1727 10/25/17 0320 10/26/17 0327 10/27/17 0355 10/28/17 0416 10/29/17 0303  NA  --  135 138 137 136 136  K  --  3.6 3.4* 3.8 3.5 3.6  CL  --  104 110 110 105 104  CO2  --  22 22 20* 24 24  GLUCOSE  --  109* 128* 106* 94 105*  BUN  --  <5* 6* <5* <5* <5*  CREATININE  --  0.83 0.97 0.88 0.94 0.89  CALCIUM  --  8.1* 7.5* 7.6* 8.0* 7.8*  MG 1.9  --   --   --  1.7 1.9   Liver Function Tests: Recent Labs  Lab 10/24/17 1425 10/26/17 0327  AST 46* 37  ALT 41 38  ALKPHOS 73 63  BILITOT 1.2 1.2  PROT 5.9* 4.5*  ALBUMIN 3.0* 2.3*   Recent Labs  Lab 10/24/17 1425  LIPASE 26   Recent Labs  Lab 10/24/17 1735  AMMONIA 18   CBC: Recent Labs  Lab 10/25/17 0320 10/26/17 0327 10/27/17 0355 10/28/17 0416  10/29/17 0303  WBC 6.4 5.0 5.8 5.0 5.2  HGB 13.3 12.1* 11.8* 11.6* 11.8*  HCT 41.0 37.6* 37.6* 36.4* 36.1*  MCV 96.7 98.2 100.0 99.5 97.3  PLT 175 175 164 178 192   Cardiac Enzymes: No results for input(s): CKTOTAL, CKMB, CKMBINDEX, TROPONINI in the last 168 hours. BNP: BNP (last 3 results) Recent Labs    10/24/17 1727  BNP 32.4    ProBNP (last 3 results) No results for input(s): PROBNP in the last 8760 hours.  CBG: No results for input(s): GLUCAP in the last 168 hours.     Signed:  Lacretia Nicksaldwell Powell MD.  [facility] 10/29/2017, 11:35 AM

## 2017-10-30 LAB — CULTURE, BLOOD (ROUTINE X 2)
CULTURE: NO GROWTH
SPECIAL REQUESTS: ADEQUATE

## 2019-07-01 IMAGING — CT CT ABD-PELV W/ CM
2 of 5 series · 15 of 46 positions shown, 17 images · IV contrast (Omni 300)
Comparison: None.

CLINICAL DATA: History of recent cholecystectomy with abdominal
pain, initial encounter

EXAM:
CT ABDOMEN AND PELVIS WITH CONTRAST
TECHNIQUE: Multidetector CT imaging of the abdomen and pelvis was performed
using the standard protocol following bolus administration of
intravenous contrast.
CONTRAST:  100mL OMNIPAQUE IOHEXOL 300 MG/ML  SOLN

[Series 3: a/p w/ 5mm · axial · 0.77mm/px · z∈[+922,+1402]mm · 12 of 110 slices shown, 14 images]
[im 7/110  soft-tissue]
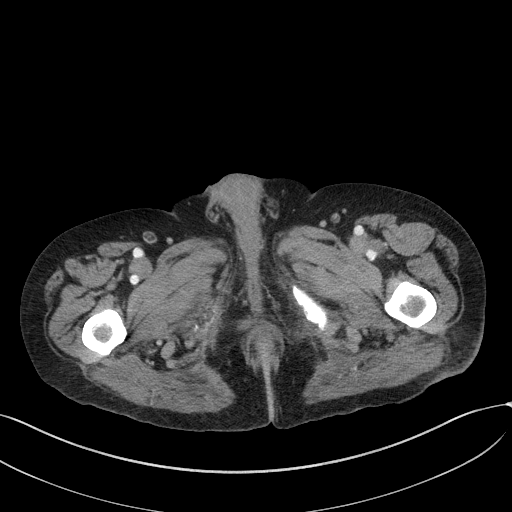
[im 7/110  bone]
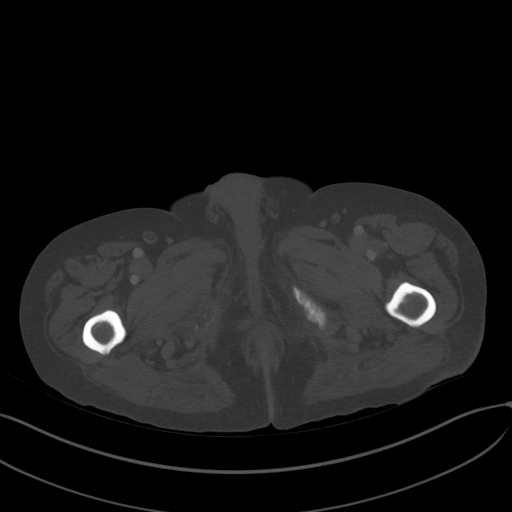
[im 20/110  soft-tissue]
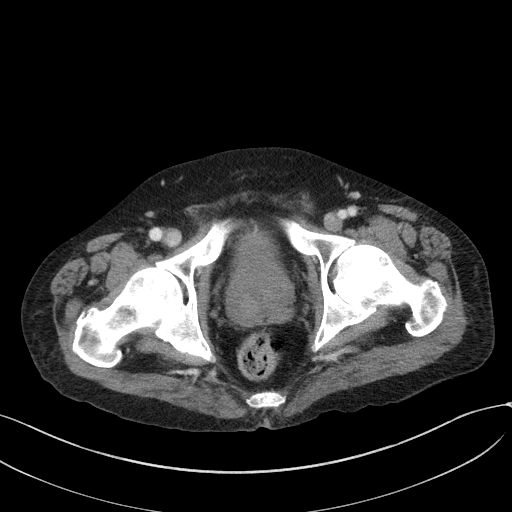
[im 26/110  soft-tissue]
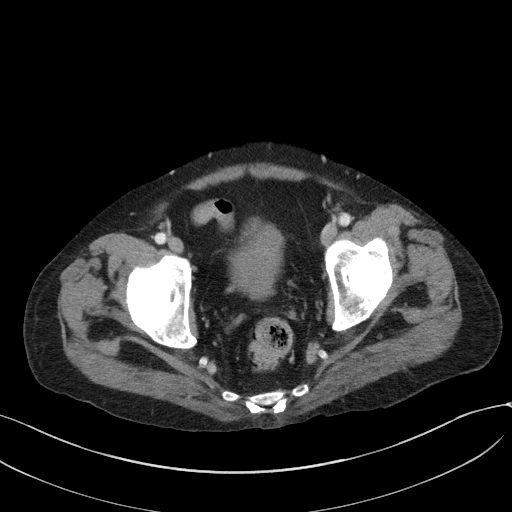
[im 33/110  soft-tissue]
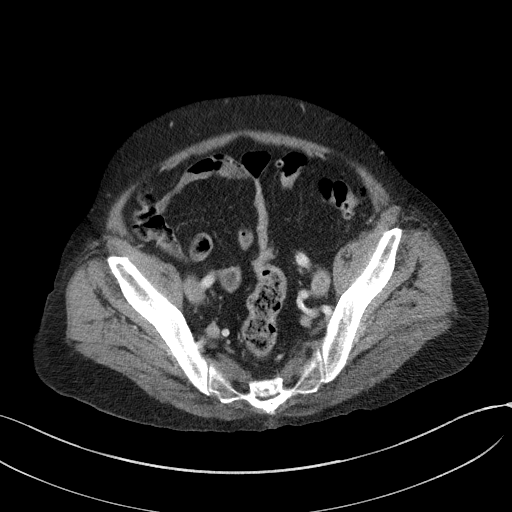
[im 45/110  soft-tissue]
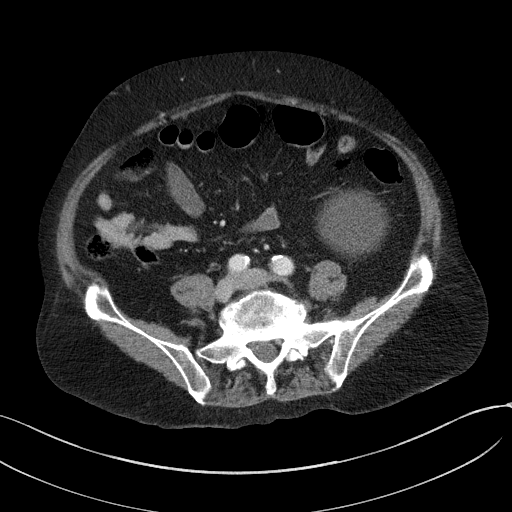
[im 52/110  soft-tissue]
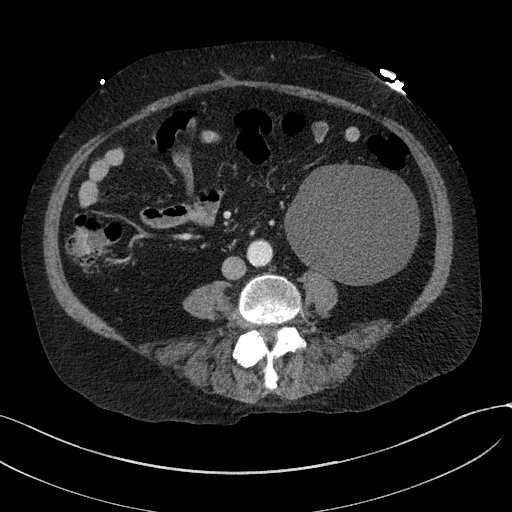
[im 58/110  soft-tissue]
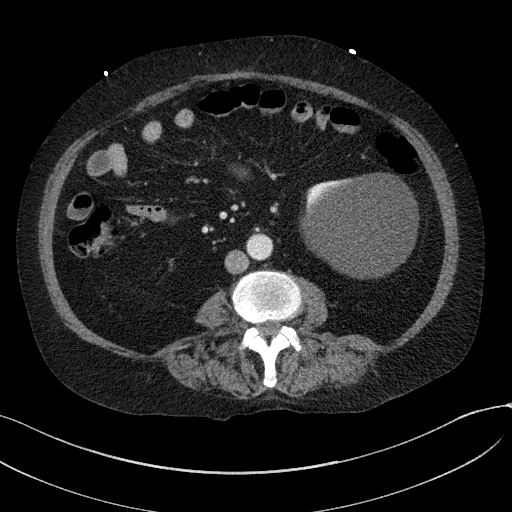
[im 71/110  soft-tissue]
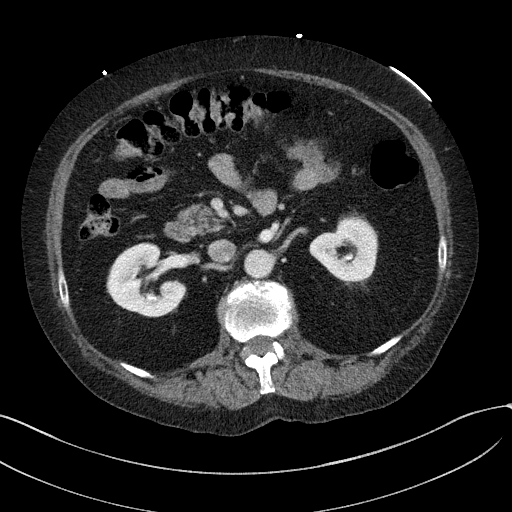
[im 77/110  soft-tissue]
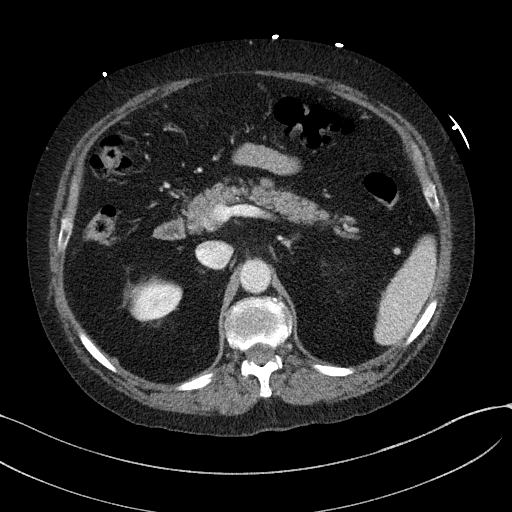
[im 77/110  bone]
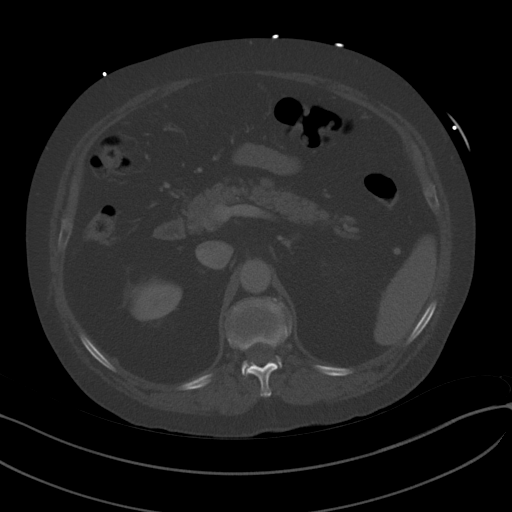
[im 84/110  soft-tissue]
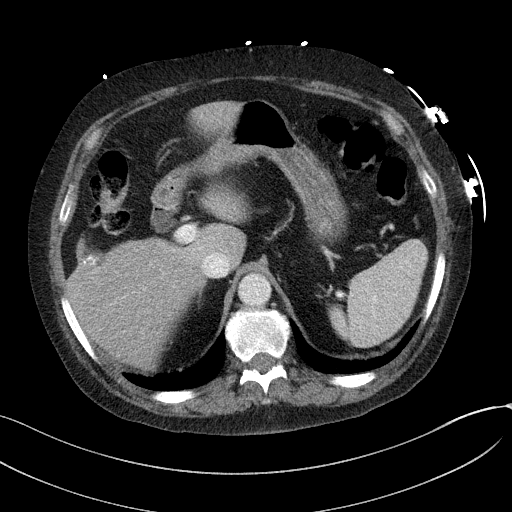
[im 97/110  soft-tissue]
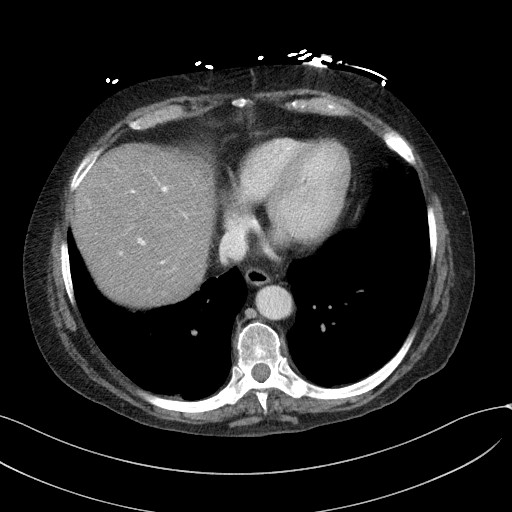
[im 103/110  soft-tissue]
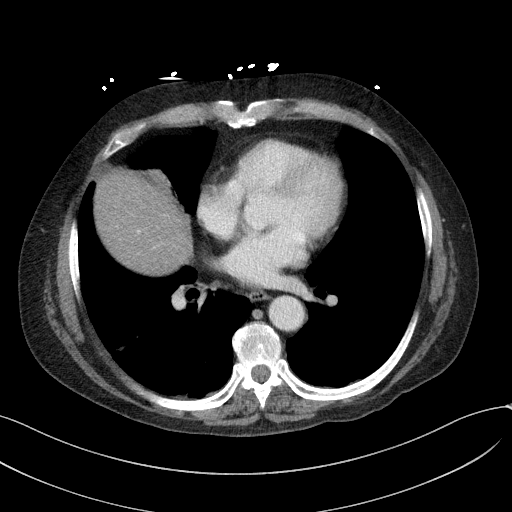

[Series 6: a/p w/ cor · coronal · 0.68mm/px · 3 of 149 slices shown]
[im 50/149  soft-tissue]
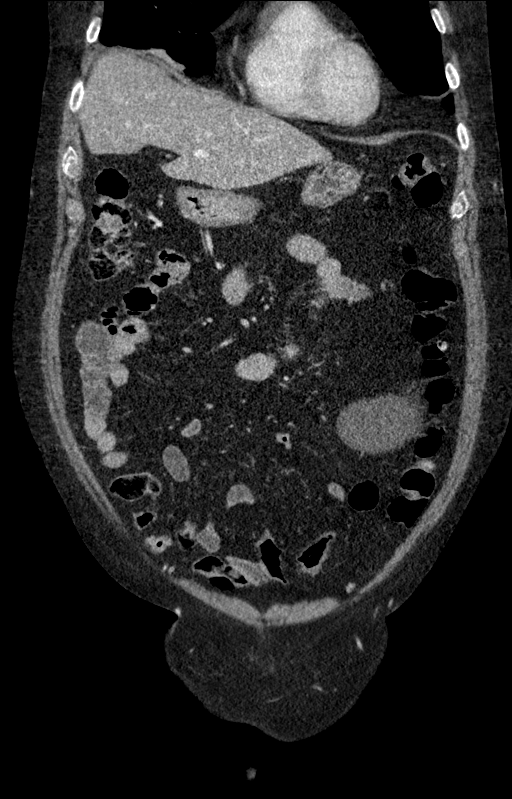
[im 66/149  soft-tissue]
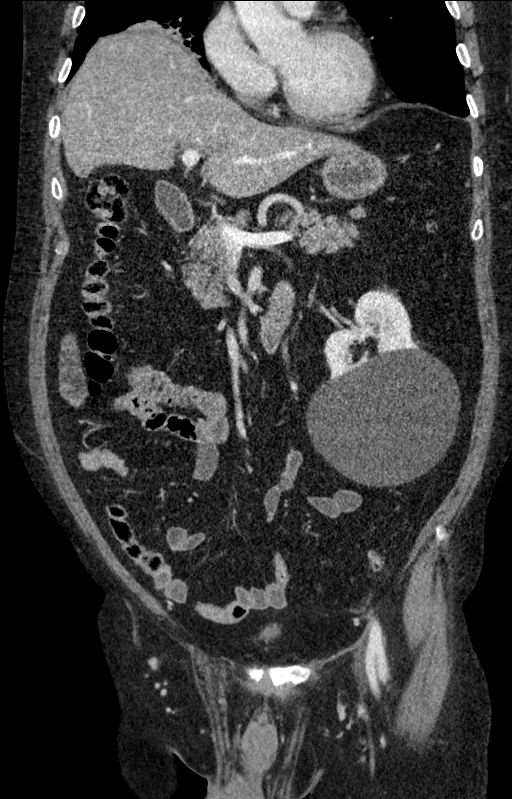
[im 83/149  soft-tissue]
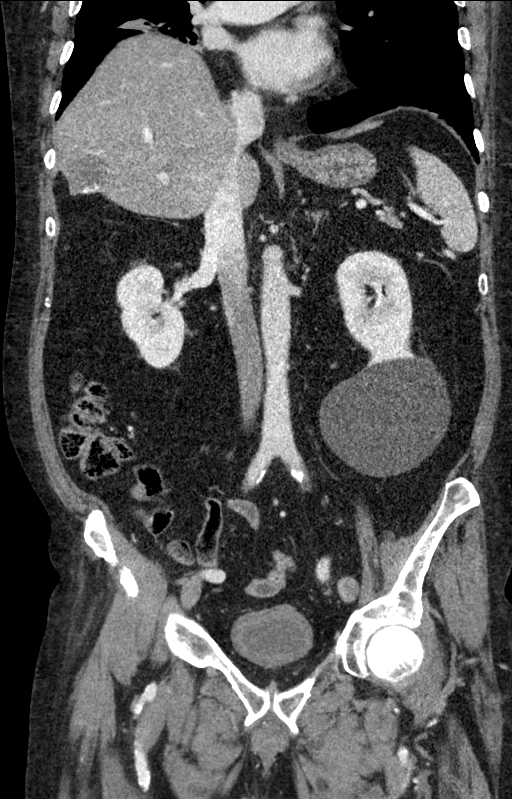

[15 of 46 positions shown; findings below may reference images not displayed]

FINDINGS: Lower chest: Within normal limits.

Hepatobiliary: Gallbladder has been surgically removed. No
postsurgical fluid collection is identified. Fatty infiltration of
the liver is noted. Hypodense lesion is noted measuring 2.8 cm in
the right lobe of the liver laterally. It shows changes of
peripheral calcification and may represent a complicated cyst.
Nonemergent ultrasound may be helpful for further evaluation.

Pancreas: Unremarkable. No pancreatic ductal dilatation or
surrounding inflammatory changes.

Spleen: Normal in size without focal abnormality.

Adrenals/Urinary Tract: Adrenal glands are within normal limits.
Kidneys are well visualized bilaterally. Excretion of contrast is
noted. No obstructive changes are seen. A large left renal cyst is
noted measuring 10.6 cm. Bladder is partially distended.

Stomach/Bowel: Scattered diverticular change of the colon is noted
without evidence of diverticulitis. No obstructive or inflammatory
changes are seen. The appendix is not well appreciated and may have
been surgically removed. No inflammatory changes are seen. No small
bowel abnormality is noted. Stomach is within normal limits.

Vascular/Lymphatic: Aortic atherosclerosis. No enlarged abdominal or
pelvic lymph nodes.

Reproductive: Prostate is unremarkable.

Other: No abdominal wall hernia or abnormality. No abdominopelvic
ascites.

Musculoskeletal: Degenerative changes of lumbar spine are seen.
IMPRESSION: Fatty infiltration of the liver.

Hypodense lesion in the right lobe of the liver with peripheral
calcifications suggestive of complicated cyst. Nonemergent
ultrasound would be helpful for further evaluation.

Large left renal cyst.

Diverticulosis without diverticulitis.
